# Patient Record
Sex: Female | Born: 1960
Health system: Southern US, Community
[De-identification: ages and names within clinical notes are randomized; demographics above are authoritative.]

## PROBLEM LIST (undated history)

## (undated) DIAGNOSIS — Z973 Presence of spectacles and contact lenses: Secondary | ICD-10-CM

## (undated) DIAGNOSIS — D649 Anemia, unspecified: Secondary | ICD-10-CM

## (undated) DIAGNOSIS — N893 Dysplasia of vagina, unspecified: Secondary | ICD-10-CM

## (undated) DIAGNOSIS — Z6834 Body mass index (BMI) 34.0-34.9, adult: Secondary | ICD-10-CM

## (undated) DIAGNOSIS — G43909 Migraine, unspecified, not intractable, without status migrainosus: Secondary | ICD-10-CM

## (undated) DIAGNOSIS — Z789 Other specified health status: Secondary | ICD-10-CM

## (undated) HISTORY — DX: Migraine, unspecified, not intractable, without status migrainosus: G43.909

## (undated) HISTORY — PX: OTHER SURGICAL HISTORY: SHX169

## (undated) HISTORY — PX: MYOMECTOMY ABDOMINAL APPROACH: SUR870

## (undated) HISTORY — PX: WISDOM TOOTH EXTRACTION: SHX21

## (undated) HISTORY — DX: Body mass index (BMI) 34.0-34.9, adult: Z68.34

---

## 1998-01-16 ENCOUNTER — Ambulatory Visit (HOSPITAL_COMMUNITY): Admission: RE | Admit: 1998-01-16 | Discharge: 1998-01-16 | Payer: Self-pay | Admitting: Internal Medicine

## 1998-01-28 ENCOUNTER — Ambulatory Visit (HOSPITAL_COMMUNITY): Admission: RE | Admit: 1998-01-28 | Discharge: 1998-01-28 | Payer: Self-pay | Admitting: Internal Medicine

## 1998-01-28 ENCOUNTER — Encounter: Payer: Self-pay | Admitting: Internal Medicine

## 2000-10-10 ENCOUNTER — Ambulatory Visit (HOSPITAL_COMMUNITY): Admission: RE | Admit: 2000-10-10 | Discharge: 2000-10-10 | Payer: Self-pay | Admitting: Obstetrics and Gynecology

## 2000-10-10 ENCOUNTER — Encounter: Payer: Self-pay | Admitting: Obstetrics and Gynecology

## 2001-05-08 ENCOUNTER — Encounter: Payer: Self-pay | Admitting: Obstetrics & Gynecology

## 2001-05-08 ENCOUNTER — Ambulatory Visit (HOSPITAL_COMMUNITY): Admission: RE | Admit: 2001-05-08 | Discharge: 2001-05-08 | Payer: Self-pay | Admitting: Obstetrics & Gynecology

## 2001-09-11 ENCOUNTER — Encounter (INDEPENDENT_AMBULATORY_CARE_PROVIDER_SITE_OTHER): Payer: Self-pay | Admitting: *Deleted

## 2001-09-11 ENCOUNTER — Ambulatory Visit (HOSPITAL_BASED_OUTPATIENT_CLINIC_OR_DEPARTMENT_OTHER): Admission: RE | Admit: 2001-09-11 | Discharge: 2001-09-11 | Payer: Self-pay | Admitting: General Surgery

## 2002-07-04 ENCOUNTER — Other Ambulatory Visit: Admission: RE | Admit: 2002-07-04 | Discharge: 2002-07-04 | Payer: Self-pay | Admitting: Obstetrics & Gynecology

## 2003-01-03 ENCOUNTER — Encounter (INDEPENDENT_AMBULATORY_CARE_PROVIDER_SITE_OTHER): Payer: Self-pay | Admitting: Specialist

## 2003-01-03 ENCOUNTER — Inpatient Hospital Stay (HOSPITAL_COMMUNITY): Admission: RE | Admit: 2003-01-03 | Discharge: 2003-01-05 | Payer: Self-pay | Admitting: Obstetrics and Gynecology

## 2003-03-06 ENCOUNTER — Ambulatory Visit (HOSPITAL_COMMUNITY): Admission: RE | Admit: 2003-03-06 | Discharge: 2003-03-06 | Payer: Self-pay | Admitting: Obstetrics and Gynecology

## 2003-03-06 ENCOUNTER — Encounter: Payer: Self-pay | Admitting: Obstetrics and Gynecology

## 2005-02-17 ENCOUNTER — Other Ambulatory Visit: Admission: RE | Admit: 2005-02-17 | Discharge: 2005-02-17 | Payer: Self-pay | Admitting: Obstetrics and Gynecology

## 2005-05-03 ENCOUNTER — Ambulatory Visit: Payer: Self-pay | Admitting: Internal Medicine

## 2005-08-09 ENCOUNTER — Encounter: Admission: RE | Admit: 2005-08-09 | Discharge: 2005-08-09 | Payer: Self-pay | Admitting: Internal Medicine

## 2005-08-24 ENCOUNTER — Encounter: Admission: RE | Admit: 2005-08-24 | Discharge: 2005-08-24 | Payer: Self-pay | Admitting: Internal Medicine

## 2008-03-26 ENCOUNTER — Ambulatory Visit: Payer: Self-pay | Admitting: Internal Medicine

## 2008-06-28 ENCOUNTER — Ambulatory Visit: Payer: Self-pay | Admitting: Internal Medicine

## 2009-06-26 ENCOUNTER — Ambulatory Visit: Payer: Self-pay | Admitting: Internal Medicine

## 2010-06-28 ENCOUNTER — Encounter: Payer: Self-pay | Admitting: Orthopedic Surgery

## 2010-10-23 NOTE — Op Note (Signed)
Victoria Bush, Victoria Bush                       ACCOUNT NO.:  [id number]   MEDICAL RECORD NO.:  [id number]                   PATIENT TYPE:  INP   LOCATION:  9399                                 FACILITY:  [facility]   PHYSICIAN:  Guy Sandifer. Arleta Creek, M.D.           DATE OF BIRTH:  November 10, 1960   DATE OF PROCEDURE:  01/03/2003  DATE OF DISCHARGE:                                 OPERATIVE REPORT   PREOPERATIVE DIAGNOSIS:  Uterine leiomyomata.   POSTOPERATIVE DIAGNOSES:  1. Uterine leiomyomata.  2. Pelvic adhesions.   PROCEDURE:  Uterine myomectomy and lysis of adhesions.   SURGEON:  Guy Sandifer. Henderson Cloud, M.D.   ASSISTANTStann Mainland. Vincente Poli, M.D.   ANESTHESIA:  General with endotracheal intubation, Burnett Corrente, M.D.   ESTIMATED BLOOD LOSS:  200 mL.   SPECIMENS:  Uterine leiomyomata.   INDICATIONS AND CONSENT:  This patient is a 50 year old married black  female, G0, P0, status post uterine myomectomy several years ago with  recurrent uterine leiomyomata.  Details are dictated in the history and  physical.  Laparotomy with uterine myomectomy has been discussed.  Potential  risks and complications have been discussed with the patient preoperatively,  including but not limited to infection, bowel, bladder, or ureteral damage,  bleeding requiring transfusion of blood products with possible transfusion  reaction, HIV and hepatitis acquisition, DVT, PE, pneumonia, recurrent  uterine leiomyomata, and hysterectomy and postoperative tubal obstruction  and infertility.  All questions have been answered and consent is signed on  the chart.   FINDINGS:  There are filmy adhesions of the epiploica to the uterine fundus  that extends across the fundus and over to the left adnexa.  There is  approximately a 6 cm subserosal leiomyoma on the top of the uterus.  There  is a 3-4 cm leiomyoma on the right side of the uterus below the location of  the fallopian tube.  About two-thirds of the fibroid is  on the anterior side  of the uterus and about one-third extends through the meso underneath the  right fallopian tube.  There is a third leiomyoma about 2 cm in diameter  that is subserosal on the lower anterior uterine segment but above the level  of the vesicouterine peritoneum.  The right fallopian tube appears to be  free with delicate-appearing fimbriae.  There are some filmy adhesions of  the right ovary to the pelvic sidewall.  The left fallopian tube appears to  be involved in the adhesive process, and the end of the tube cannot be  identified with certainty.  The left ovary is also adherent to the left  pelvic sidewall.   DESCRIPTION OF PROCEDURE:  The patient is taken to operating room #2, placed  in the dorsal supine position, where general anesthesia is induced via  endotracheal intubation.  She is then prepped abdominally and vaginally, a  Foley catheter is placed and the bladder is drained,  and she is draped in a  sterile fashion.  Incision is made through the previous Pfannenstiel scar  and dissection is carried out in layers to the peritoneum.  She does have  some rather dense subcutaneous scarring.  The peritoneum is entered and  extended superiorly and inferiorly.  An O'Connor-O'Sullivan retractor was  placed, bowel was packed away, and the superior and inferior blades are  placed.  The adhesions to the uterine fundus are taken down sharply.  This  is done under good visualization and clear of the bowel.  The serosa  overlying the larger fibroid is incised with cautery.  This is then  dissected with blunt and sharp dissection.  This enters at least one-half  the thickness of the myometrium, although it does not enter the uterine  cavity.  The defect is closed with 0 Monocryl figure-of-eights and 3-0  Monocryl in a running locking fashion on the serosa.  The fibroid on the  right side of the uterus is entered anteriorly, staying clear of the  fallopian tube.  This is then  also dissected in a similar fashion.  The  incision is extended over the superior part of the fibroid on the anterior  uterine segment, and this is also dissected without difficulty.  This also  stays clear of the bladder.  Defects are closed again with figure-of-eight 0  Monocryls, and the serosa is closed with running locking 3-0 Monocryl.  Good  hemostasis is obtained all around.  Copious irrigation is carried out.  Interceed is placed over the uterine fundus over the suture line.  Packs are  removed, retractors are removed.  All counts are correct.  The anterior  peritoneum is closed in a running fashion with 0 Monocryl suture, which is  also used to reapproximate the pyramidalis muscle in the midline.  The  anterior rectus fascia is closed in a running fashion with 0 PDS suture  starting at each angle and meeting in the middle.  The subcutaneous tissue  on the inferior portion of the incision is undermined slightly with cautery  to allow good reapproximation of the skin edges.  Skin is closed with clips.  All sponge, instrument, and needle counts are correct.  The patient is  awakened and taken to the recovery room in stable condition.                                               Guy Sandifer Arleta Creek, M.D.    JET/MEDQ  D:  01/03/2003  T:  01/03/2003  Job:  [id number]

## 2010-10-23 NOTE — Discharge Summary (Signed)
   Victoria Bush, Victoria Bush                       ACCOUNT NO.:  [id number]   MEDICAL RECORD NO.:  [id number]                   PATIENT TYPE:  INP   LOCATION:  9322                                 FACILITY:  [facility]   PHYSICIAN:  Guy Sandifer. Arleta Creek, M.D.           DATE OF BIRTH:  Nov 15, 1960   DATE OF ADMISSION:  01/03/2003  DATE OF DISCHARGE:  01/05/2003                                 DISCHARGE SUMMARY   ADMITTING DIAGNOSIS:  Uterine leiomyomata.   DISCHARGE DIAGNOSIS:  Uterine leiomyomata.   PROCEDURE:  On January 03, 2003, uterine myomectomy and lysis of adhesions.   REASON FOR ADMISSION:  This patient is a 50 year old married black female,  G0, P0, who is status post uterine myomectomy several years before.  She has  had recurrent growth of fibroids which are now symptomatic.  Details are  dictated in the history and physical.  She is being admitted for surgical  management.   HOSPITAL COURSE:  She was taken to the operating room and underwent the  above procedure.  Estimated blood loss was 200 cc.  On the evening of  surgery, she had good pain relief, was hungry, and was using her incentive  spirometer.  Vital signs were stable, she was afebrile with a clear urine  output.  On the first postoperative day, she was ambulating and voiding.  She had not yet passed flatus.  She remained with stable vital signs, and  was afebrile.  White count was 7.5, hemoglobin 10.7.  On January 05, 2003, she  was doing well, ambulating, tolerating a regular diet.   CONDITION ON DISCHARGE:  Good.   DIET:  Regular as tolerated.   ACTIVITY:  No lifting.  No operation of automobiles.  No vaginal entry.   DISCHARGE INSTRUCTIONS:  She is to call the office for problems including,  but not limited to, temperature of 101 degrees, heavy vaginal bleeding,  persistent nausea and vomiting, or increasing pain.   MEDICATIONS:  Percocet 5/325 mg, #30, 1-2 p.o. q.6h. p.r.n.   FOLLOW UP:  In the office in two  weeks.                                               Guy Sandifer Arleta Creek, M.D.    JET/MEDQ  D:  03/05/2003  T:  03/05/2003  Job:  [id number]

## 2010-10-23 NOTE — H&P (Signed)
NAME:  Victoria Bush, Victoria Bush                       ACCOUNT NO.:  [id number]   MEDICAL RECORD NO.:  [id number]                   PATIENT TYPE:  INP   LOCATION:  NA                                   [facility]ACILITY[facility]  [facility]   PHYSICIAN:  Guy Sandifer. Arleta Creek, M.D.           DATE OF BIRTH:  [birth year]   DATE OF ADMISSION:  01/03/2003  DATE OF DISCHARGE:                                HISTORY & PHYSICAL   CHIEF COMPLAINT:  Uterine fibroids.   HISTORY OF PRESENT ILLNESS:  This patient is a 50 year old married black  female, G0, P0, who is status post uterine myomectomy several years ago.  Since then she has had the recurrence of growing fibroids.  This has been  accompanied by almost daily spotting.  She has increasingly heavy clotting  with her menses as well as bumper dyspareunia.  On examination the uterus  rises to approximately one-half the distance to the umbilicus.  On  ultrasound in my office on September 10, 2002, the uterus measures 11.8 x 7.8 x  7.5 cm with at least four leiomyomata noted ranging from 1.7 to 6.3 cm.  The  ovaries had small follicular cysts but no dominant mass.  On sonohysterogram  there were no intracavitary masses.  The patient reports a normal Pap smear  this year.  After a careful discussion of the options of management, the  patient requests myomectomy.  The potential risks and complications have  been carefully reviewed with the patient preoperatively.  This has included  a discussion of surgical risks as well as possible bilateral tubal  obstruction and recurrent leiomyomata postoperatively.   PAST MEDICAL HISTORY:  1. Uterine leiomyomata as above.  2. Migraine headaches.   PAST SURGICAL HISTORY:  1. Laparotomy with uterine myomectomy.  2. Removal of left and right breast cysts, benign.   PAST OBSTETRICAL HISTORY:  Negative.   FAMILY HISTORY:  Throat cancer in mother, esophageal cancer in father,  hypothyroidism in sister.   SOCIAL HISTORY:  The patient denies  tobacco, alcohol, or drug abuse.   MEDICATIONS:  Vitamins and iron supplements.   No known drug allergies.   REVIEW OF SYSTEMS:  NEUROLOGIC:  History of migraine headaches as above.  CARDIAC:  The patient denies chest pain. PULMONARY:  Denies shortness of  breath.   PHYSICAL EXAMINATION:  VITAL SIGNS:  Height 4 feet 11 inches, weight 155  pounds.  Blood pressure 110/70.  HEENT:  Without thyromegaly.  CHEST:  Lungs clear to auscultation.  CARDIAC:  Regular rate and rhythm.  BACK:  Without CVA tenderness.  BREASTS:  Without mass, retraction, or discharge.  ABDOMEN:  Soft, nontender, with a midline pelvic mass arising approximately  one-half the distance to the umbilicus, which is smooth and nontender.  PELVIC:  Vulva, vagina, and cervix without lesions.  The uterus is mobile,  enlarged, irregular contour, 10-12 weeks in size.  Adnexal exam totally  compromised.  EXTREMITIES:  Grossly within normal limits.  NEUROLOGIC:  Grossly within normal limits.   ASSESSMENT:  Recurrent uterine leiomyomata.   PLAN:  Laparotomy with uterine myomectomy.                                                Guy Sandifer Arleta Creek, M.D.    JET/MEDQ  D:  12/31/2002  T:  01/01/2003  Job:  [id number]

## 2010-10-23 NOTE — Op Note (Signed)
[facility]  Patient:    [name], Victoria Bush Visit Number: [id number] MRN: [id number]          Service Type: DSU Location: [facility] Attending Physician:  Janalyn Rouse Dictated by:   Rose Phi. Maple Hudson, M.D. Proc. Date: 09/11/01 Admit Date:  09/11/2001                             Operative Report  PREOPERATIVE DIAGNOSIS:  Fibrocystic disease of the right breast.  POSTOPERATIVE DIAGNOSIS:  Fibrocystic disease of the right breast.  OPERATION PERFORMED:  Right breast biopsy.  SURGEON:  Rose Phi. Maple Hudson, M.D.  ANESTHESIA:  MAC.  INDICATIONS FOR PROCEDURE:  The patient had a persistent nodular area in the upper outer quadrant of her right breast which was causing her a fair amount of discomfort.  Because of its palpability and its persistent discomfort, we elected to excise it.  DESCRIPTION OF PROCEDURE:  The patient was placed on the operating table with the right arm extended on the arm board.  The right breast was prepped and draped in the usual fashion.  The curvilinear incision was outlined over the palpable nodular area and the area infiltrated with 1% Xylocaine with Adrenalin.  Incision was made with sharp dissection.  This area of breast tissue was excised.  The breast tissue was extremely dense actually very difficult to cut and I think this represents why she was having trouble with it.  Hemostasis obtained with the cautery.  Subcuticular closure with 4-0 Monocryl and Steri-Strips carried.  Dressing applied.  The patient was transferred to the recovery room in satisfactory condition having tolerated the procedure well. Dictated by:   Rose Phi. Maple Hudson, M.D. Attending Physician:  Janalyn Rouse DD:  09/11/01 TD:  09/11/01 Job: [id number]

## 2010-11-26 ENCOUNTER — Ambulatory Visit (HOSPITAL_COMMUNITY): Admission: RE | Admit: 2010-11-26 | Payer: Self-pay | Source: Ambulatory Visit | Admitting: Obstetrics and Gynecology

## 2012-06-12 ENCOUNTER — Encounter (HOSPITAL_COMMUNITY): Payer: Self-pay | Admitting: Pharmacist

## 2012-06-12 NOTE — H&P (Signed)
Victoria Bush is an 52 y.o. female with large uterine fibroids causing pressure and discomfort.  Pertinent Gynecological History: Menses: entering menopause Bleeding: none Contraception: none DES exposure: denies Blood transfusions: none Sexually transmitted diseases: no past history Previous GYN Procedures: none  Last mammogram: normal Date: 2013 Last pap: normal Date: 2013 OB History: G0, P0   Menstrual History: Menarche age: unknown No LMP recorded.    No past medical history on file.  No past surgical history on file.  No family history on file.  Social History:  does not have a smoking history on file. She does not have any smokeless tobacco history on file. Her alcohol and drug histories not on file.  Allergies: No Known Allergies  No prescriptions prior to admission    Review of Systems  Constitutional: Negative for fever.    There were no vitals taken for this visit. Physical Exam  Constitutional: She appears well-developed and well-nourished.  Cardiovascular: Normal rate and regular rhythm.   Respiratory: Effort normal and breath sounds normal.  GI: There is no tenderness.       Mass arising in pelvis to 2 FB below umbilicus  Genitourinary:       Uterus fills pelvis    No results found for this or any previous visit (from the past 24 hour(s)).  No results found.  Assessment/Plan: 52 yo perimenopausal G0P0 with symptomatic uterine fibroids for TAH/BSO. Risks reviewed including infection, organ damage, bleeding/transfusion-HIV/Hep, DVT/PE, pneumonia, fistula, pelvic/abd pain, pain with intercourse.  Victoria Bush II,Victoria Bush E 06/12/2012, 1:54 PM

## 2012-06-16 ENCOUNTER — Encounter (HOSPITAL_COMMUNITY): Payer: Self-pay

## 2012-06-16 ENCOUNTER — Encounter (HOSPITAL_COMMUNITY)
Admission: RE | Admit: 2012-06-16 | Discharge: 2012-06-16 | Disposition: A | Discharge status: Home / Self Care | Payer: BC Managed Care – PPO | Source: Ambulatory Visit | Attending: Obstetrics and Gynecology | Admitting: Obstetrics and Gynecology

## 2012-06-16 HISTORY — DX: Other specified health status: Z78.9

## 2012-06-16 LAB — SURGICAL PCR SCREEN
MRSA, PCR: NEGATIVE
Staphylococcus aureus: POSITIVE — AB

## 2012-06-16 LAB — BASIC METABOLIC PANEL
BUN: 11 mg/dL (ref 6–23)
Creatinine, Ser: 0.72 mg/dL (ref 0.50–1.10)
GFR calc Af Amer: >90 mL/min (ref >90)
GFR calc non Af Amer: >90 mL/min (ref >90)
Glucose, Bld: 88 mg/dL (ref 70–99)

## 2012-06-16 LAB — CBC
HCT: 39 % (ref 36.0–46.0)
Hemoglobin: 13 g/dL (ref 12.0–15.0)
MCH: 27.4 pg (ref 26.0–34.0)
MCHC: 33.3 g/dL (ref 30.0–36.0)
RDW: 13.2 % (ref 11.5–15.5)

## 2012-06-16 NOTE — Patient Instructions (Addendum)
20 Victoria Bush  06/16/2012   Your procedure is scheduled on:  06/19/12  Enter through the Main Entrance of [facility] at 6 AM.  Pick up the phone at the desk and dial [phone].   Call this number if you have problems the morning of surgery: [phone]   Remember:   Do not eat food:After Midnight.  Do not drink clear liquids: After Midnight.  Take these medicines the morning of surgery with A SIP OF WATER: NA   Do not wear jewelry, make-up or nail polish.  Do not wear lotions, powders, or perfumes. You may wear deodorant.  Do not shave 48 hours prior to surgery.  Do not bring valuables to the hospital.  Contacts, dentures or bridgework may not be worn into surgery.  Leave suitcase in the car. After surgery it may be brought to your room.  For patients admitted to the hospital, checkout time is 11:00 AM the day of discharge.   Patients discharged the day of surgery will not be allowed to drive home.  Name and phone number of your driver: NA  Special Instructions: Shower using CHG 2 nights before surgery and the night before surgery.  If you shower the day of surgery use CHG.  Use special wash - you have one bottle of CHG for all showers.  You should use approximately 1/3 of the bottle for each shower.   Please read over the following fact sheets that you were given: MRSA Information

## 2012-06-18 ENCOUNTER — Encounter (HOSPITAL_COMMUNITY): Payer: Self-pay | Admitting: Anesthesiology

## 2012-06-18 NOTE — Anesthesia Preprocedure Evaluation (Addendum)
Anesthesia Evaluation  Patient identified by MRN, date of birth, ID band Patient awake    Reviewed: Allergy & Precautions, H&P , NPO status , Patient's Chart, lab work & pertinent test results  Airway Mallampati: III TM Distance: >3 FB Neck ROM: Full    Dental No notable dental hx. (+) Teeth Intact   Pulmonary neg pulmonary ROS,  breath sounds clear to auscultation  Pulmonary exam normal       Cardiovascular negative cardio ROS  Rhythm:Regular Rate:Normal     Neuro/Psych negative neurological ROS  negative psych ROS   GI/Hepatic negative GI ROS, Neg liver ROS,   Endo/Other  negative endocrine ROS  Renal/GU negative Renal ROS  negative genitourinary   Musculoskeletal negative musculoskeletal ROS (+)   Abdominal   Peds  Hematology negative hematology ROS (+)   Anesthesia Other Findings   Reproductive/Obstetrics Leiomyoma                          Anesthesia Physical Anesthesia Plan  ASA: I  Anesthesia Plan: General   Post-op Pain Management:    Induction: Intravenous  Airway Management Planned: Oral ETT  Additional Equipment:   Intra-op Plan:   Post-operative Plan: Extubation in OR  Informed Consent: I have reviewed the patients History and Physical, chart, labs and discussed the procedure including the risks, benefits and alternatives for the proposed anesthesia with the patient or authorized representative who has indicated his/her understanding and acceptance.   Dental advisory given  Plan Discussed with: CRNA, Anesthesiologist and Surgeon  Anesthesia Plan Comments:         Anesthesia Quick Evaluation

## 2012-06-19 ENCOUNTER — Encounter (HOSPITAL_COMMUNITY): Payer: Self-pay | Admitting: *Deleted

## 2012-06-19 ENCOUNTER — Inpatient Hospital Stay (HOSPITAL_COMMUNITY)
Admission: RE | Admit: 2012-06-19 | Discharge: 2012-06-21 | DRG: 359 | Disposition: A | Discharge status: Home / Self Care | Payer: BC Managed Care – PPO | Source: Ambulatory Visit | Attending: Obstetrics and Gynecology | Admitting: Obstetrics and Gynecology

## 2012-06-19 ENCOUNTER — Encounter (HOSPITAL_COMMUNITY)
Admission: RE | Disposition: A | Discharge status: Home / Self Care | Payer: Self-pay | Source: Ambulatory Visit | Attending: Obstetrics and Gynecology

## 2012-06-19 ENCOUNTER — Encounter (HOSPITAL_COMMUNITY): Payer: Self-pay | Admitting: Anesthesiology

## 2012-06-19 ENCOUNTER — Inpatient Hospital Stay (HOSPITAL_COMMUNITY): Payer: BC Managed Care – PPO | Admitting: Anesthesiology

## 2012-06-19 DIAGNOSIS — D259 Leiomyoma of uterus, unspecified: Principal | ICD-10-CM | POA: Diagnosis present

## 2012-06-19 DIAGNOSIS — Z01818 Encounter for other preprocedural examination: Secondary | ICD-10-CM

## 2012-06-19 DIAGNOSIS — N80109 Endometriosis of ovary, unspecified side, unspecified depth: Secondary | ICD-10-CM | POA: Diagnosis present

## 2012-06-19 DIAGNOSIS — Z01812 Encounter for preprocedural laboratory examination: Secondary | ICD-10-CM

## 2012-06-19 DIAGNOSIS — N80209 Endometriosis of unspecified fallopian tube, unspecified depth: Secondary | ICD-10-CM | POA: Diagnosis present

## 2012-06-19 DIAGNOSIS — N8000 Endometriosis of the uterus, unspecified: Secondary | ICD-10-CM | POA: Diagnosis present

## 2012-06-19 DIAGNOSIS — N802 Endometriosis of fallopian tube: Secondary | ICD-10-CM | POA: Diagnosis present

## 2012-06-19 DIAGNOSIS — N801 Endometriosis of ovary: Secondary | ICD-10-CM | POA: Diagnosis present

## 2012-06-19 HISTORY — PX: ABDOMINAL HYSTERECTOMY: SHX81

## 2012-06-19 HISTORY — PX: SALPINGOOPHORECTOMY: SHX82

## 2012-06-19 LAB — CBC
HCT: 38.9 % (ref 36.0–46.0)
Hemoglobin: 12.8 g/dL (ref 12.0–15.0)
RDW: 13.1 % (ref 11.5–15.5)
WBC: 13.6 10*3/uL — ABNORMAL HIGH (ref 4.0–10.5)

## 2012-06-19 SURGERY — HYSTERECTOMY, ABDOMINAL
Anesthesia: General | Site: Abdomen | Wound class: Clean Contaminated

## 2012-06-19 MED ORDER — HYDROMORPHONE HCL PF 1 MG/ML IJ SOLN
1.0000 mg | INTRAMUSCULAR | Status: DC | PRN
  Administered 2012-06-19 – 2012-06-20 (×2): 1 mg via INTRAVENOUS
  Filled 2012-06-19 (×2): qty 1

## 2012-06-19 MED ORDER — ONDANSETRON HCL 4 MG/2ML IJ SOLN: 4.0000 mg | Freq: Four times a day (QID) | INTRAMUSCULAR | Status: DC | PRN

## 2012-06-19 MED ORDER — NEOSTIGMINE METHYLSULFATE 1 MG/ML IJ SOLN
INTRAMUSCULAR | Status: AC
  Filled 2012-06-19: qty 1

## 2012-06-19 MED ORDER — CEFAZOLIN SODIUM-DEXTROSE 2-3 GM-% IV SOLR
2.0000 g | INTRAVENOUS | Status: AC
  Administered 2012-06-19: 2 g via INTRAVENOUS

## 2012-06-19 MED ORDER — HYDROMORPHONE HCL PF 1 MG/ML IJ SOLN
0.2500 mg | INTRAMUSCULAR | Status: DC | PRN
  Administered 2012-06-19 (×2): 0.5 mg via INTRAVENOUS

## 2012-06-19 MED ORDER — OXYCODONE-ACETAMINOPHEN 5-325 MG PO TABS
1.0000 | ORAL_TABLET | ORAL | Status: DC | PRN
  Administered 2012-06-19: 2 via ORAL
  Filled 2012-06-19: qty 2

## 2012-06-19 MED ORDER — IBUPROFEN 600 MG PO TABS
600.0000 mg | ORAL_TABLET | Freq: Four times a day (QID) | ORAL | Status: DC | PRN
  Administered 2012-06-20 – 2012-06-21 (×3): 600 mg via ORAL
  Filled 2012-06-19 (×3): qty 1

## 2012-06-19 MED ORDER — SODIUM CHLORIDE 0.9 % IJ SOLN: 9.0000 mL | INTRAMUSCULAR | Status: DC | PRN

## 2012-06-19 MED ORDER — MEPERIDINE HCL 25 MG/ML IJ SOLN: 6.2500 mg | INTRAMUSCULAR | Status: DC | PRN

## 2012-06-19 MED ORDER — MIDAZOLAM HCL 5 MG/5ML IJ SOLN
INTRAMUSCULAR | Status: DC | PRN
  Administered 2012-06-19: 2 mg via INTRAVENOUS

## 2012-06-19 MED ORDER — GLYCOPYRROLATE 0.2 MG/ML IJ SOLN
INTRAMUSCULAR | Status: AC
  Filled 2012-06-19: qty 3

## 2012-06-19 MED ORDER — PHENYLEPHRINE HCL 10 MG/ML IJ SOLN
INTRAMUSCULAR | Status: DC | PRN
  Administered 2012-06-19: 120 ug via INTRAVENOUS
  Administered 2012-06-19 (×2): 80 ug via INTRAVENOUS
  Administered 2012-06-19: 40 ug via INTRAVENOUS

## 2012-06-19 MED ORDER — MIDAZOLAM HCL 2 MG/2ML IJ SOLN
INTRAMUSCULAR | Status: AC
  Filled 2012-06-19: qty 2

## 2012-06-19 MED ORDER — DIPHENHYDRAMINE HCL 12.5 MG/5ML PO ELIX: 12.5000 mg | ORAL_SOLUTION | Freq: Four times a day (QID) | ORAL | Status: DC | PRN

## 2012-06-19 MED ORDER — LIDOCAINE HCL (CARDIAC) 20 MG/ML IV SOLN
INTRAVENOUS | Status: DC | PRN
  Administered 2012-06-19: 100 mg via INTRAVENOUS

## 2012-06-19 MED ORDER — LACTATED RINGERS IV SOLN
INTRAVENOUS | Status: DC
  Administered 2012-06-19 (×2): via INTRAVENOUS
  Administered 2012-06-19: 125 mL/h via INTRAVENOUS
  Administered 2012-06-19: 09:00:00 via INTRAVENOUS

## 2012-06-19 MED ORDER — ONDANSETRON HCL 4 MG PO TABS: 4.0000 mg | ORAL_TABLET | Freq: Four times a day (QID) | ORAL | Status: DC | PRN

## 2012-06-19 MED ORDER — 0.9 % SODIUM CHLORIDE (POUR BTL) OPTIME
TOPICAL | Status: DC | PRN
  Administered 2012-06-19: 1000 mL

## 2012-06-19 MED ORDER — DEXAMETHASONE SODIUM PHOSPHATE 4 MG/ML IJ SOLN
INTRAMUSCULAR | Status: DC | PRN
  Administered 2012-06-19: 10 mg via INTRAVENOUS

## 2012-06-19 MED ORDER — NEOSTIGMINE METHYLSULFATE 1 MG/ML IJ SOLN
INTRAMUSCULAR | Status: DC | PRN
  Administered 2012-06-19: 3 mg via INTRAVENOUS

## 2012-06-19 MED ORDER — PHENYLEPHRINE 40 MCG/ML (10ML) SYRINGE FOR IV PUSH (FOR BLOOD PRESSURE SUPPORT)
PREFILLED_SYRINGE | INTRAVENOUS | Status: AC
  Filled 2012-06-19: qty 5

## 2012-06-19 MED ORDER — GLYCOPYRROLATE 0.2 MG/ML IJ SOLN
INTRAMUSCULAR | Status: DC | PRN
  Administered 2012-06-19: 0.4 mg via INTRAVENOUS

## 2012-06-19 MED ORDER — PROPOFOL 10 MG/ML IV EMUL
INTRAVENOUS | Status: DC | PRN
  Administered 2012-06-19: 200 mg via INTRAVENOUS

## 2012-06-19 MED ORDER — METOCLOPRAMIDE HCL 5 MG/ML IJ SOLN
INTRAMUSCULAR | Status: AC
  Administered 2012-06-19: 10 mg via INTRAVENOUS
  Filled 2012-06-19: qty 2

## 2012-06-19 MED ORDER — MENTHOL 3 MG MT LOZG: 1.0000 | LOZENGE | OROMUCOSAL | Status: DC | PRN

## 2012-06-19 MED ORDER — CEFAZOLIN SODIUM-DEXTROSE 2-3 GM-% IV SOLR
INTRAVENOUS | Status: AC
  Filled 2012-06-19: qty 50

## 2012-06-19 MED ORDER — ROCURONIUM BROMIDE 100 MG/10ML IV SOLN
INTRAVENOUS | Status: DC | PRN
  Administered 2012-06-19: 30 mg via INTRAVENOUS

## 2012-06-19 MED ORDER — ALUM & MAG HYDROXIDE-SIMETH 200-200-20 MG/5ML PO SUSP: 30.0000 mL | ORAL | Status: DC | PRN

## 2012-06-19 MED ORDER — EPHEDRINE 5 MG/ML INJ
INTRAVENOUS | Status: AC
  Filled 2012-06-19: qty 10

## 2012-06-19 MED ORDER — DOCUSATE SODIUM 100 MG PO CAPS
100.0000 mg | ORAL_CAPSULE | Freq: Two times a day (BID) | ORAL | Status: DC
  Administered 2012-06-20 – 2012-06-21 (×3): 100 mg via ORAL
  Filled 2012-06-19 (×3): qty 1

## 2012-06-19 MED ORDER — LACTATED RINGERS IV SOLN
INTRAVENOUS | Status: DC
  Administered 2012-06-20: 03:00:00 via INTRAVENOUS

## 2012-06-19 MED ORDER — DIPHENHYDRAMINE HCL 50 MG/ML IJ SOLN: 12.5000 mg | Freq: Four times a day (QID) | INTRAMUSCULAR | Status: DC | PRN

## 2012-06-19 MED ORDER — METOCLOPRAMIDE HCL 5 MG/ML IJ SOLN
10.0000 mg | Freq: Once | INTRAMUSCULAR | Status: AC | PRN
  Administered 2012-06-19: 10 mg via INTRAVENOUS

## 2012-06-19 MED ORDER — ZOLPIDEM TARTRATE 5 MG PO TABS: 5.0000 mg | ORAL_TABLET | Freq: Every evening | ORAL | Status: DC | PRN

## 2012-06-19 MED ORDER — HYDROMORPHONE 0.3 MG/ML IV SOLN: INTRAVENOUS | Status: DC

## 2012-06-19 MED ORDER — ROCURONIUM BROMIDE 50 MG/5ML IV SOLN
INTRAVENOUS | Status: AC
  Filled 2012-06-19: qty 1

## 2012-06-19 MED ORDER — ONDANSETRON HCL 4 MG/2ML IJ SOLN
INTRAMUSCULAR | Status: AC
  Filled 2012-06-19: qty 2

## 2012-06-19 MED ORDER — HYDROMORPHONE HCL PF 1 MG/ML IJ SOLN
INTRAMUSCULAR | Status: AC
  Administered 2012-06-19: 0.5 mg via INTRAVENOUS
  Filled 2012-06-19: qty 1

## 2012-06-19 MED ORDER — PROPOFOL 10 MG/ML IV EMUL
INTRAVENOUS | Status: AC
  Filled 2012-06-19: qty 20

## 2012-06-19 MED ORDER — EPHEDRINE SULFATE 50 MG/ML IJ SOLN
INTRAMUSCULAR | Status: DC | PRN
  Administered 2012-06-19: 10 mg via INTRAVENOUS

## 2012-06-19 MED ORDER — DEXAMETHASONE SODIUM PHOSPHATE 10 MG/ML IJ SOLN
INTRAMUSCULAR | Status: AC
  Filled 2012-06-19: qty 1

## 2012-06-19 MED ORDER — LIDOCAINE HCL (CARDIAC) 20 MG/ML IV SOLN
INTRAVENOUS | Status: AC
  Filled 2012-06-19: qty 5

## 2012-06-19 MED ORDER — FENTANYL CITRATE 0.05 MG/ML IJ SOLN
INTRAMUSCULAR | Status: AC
  Filled 2012-06-19: qty 5

## 2012-06-19 MED ORDER — ONDANSETRON HCL 4 MG/2ML IJ SOLN
INTRAMUSCULAR | Status: DC | PRN
  Administered 2012-06-19: 4 mg via INTRAVENOUS

## 2012-06-19 MED ORDER — NALOXONE HCL 0.4 MG/ML IJ SOLN: 0.4000 mg | INTRAMUSCULAR | Status: DC | PRN

## 2012-06-19 MED ORDER — FENTANYL CITRATE 0.05 MG/ML IJ SOLN
INTRAMUSCULAR | Status: DC | PRN
  Administered 2012-06-19: 100 ug via INTRAVENOUS
  Administered 2012-06-19 (×2): 50 ug via INTRAVENOUS

## 2012-06-19 SURGICAL SUPPLY — 30 items
CANISTER SUCTION 2500CC (MISCELLANEOUS) ×3 IMPLANT
CLOTH BEACON ORANGE TIMEOUT ST (SAFETY) ×3 IMPLANT
CONT PATH 16OZ SNAP LID 3702 (MISCELLANEOUS) ×3 IMPLANT
DECANTER SPIKE VIAL GLASS SM (MISCELLANEOUS) IMPLANT
DRSG OPSITE POSTOP 4X10 (GAUZE/BANDAGES/DRESSINGS) ×1 IMPLANT
ELECT LIGASURE SHORT 9 REUSE (ELECTRODE) ×1 IMPLANT
GLOVE BIO SURGEON STRL SZ 6 (GLOVE) ×1 IMPLANT
GLOVE BIO SURGEON STRL SZ8 (GLOVE) ×6 IMPLANT
GLOVE BIOGEL PI IND STRL 6.5 (GLOVE) IMPLANT
GLOVE BIOGEL PI INDICATOR 6.5 (GLOVE) ×1
GLOVE INDICATOR 6.0 STRL GRN (GLOVE) ×1 IMPLANT
GLOVE INDICATOR 6.5 STRL GRN (GLOVE) ×1 IMPLANT
GOWN PREVENTION PLUS LG XLONG (DISPOSABLE) ×6 IMPLANT
NS IRRIG 1000ML POUR BTL (IV SOLUTION) ×3 IMPLANT
PACK ABDOMINAL GYN (CUSTOM PROCEDURE TRAY) ×3 IMPLANT
PAD OB MATERNITY 4.3X12.25 (PERSONAL CARE ITEMS) ×3 IMPLANT
PROTECTOR NERVE ULNAR (MISCELLANEOUS) ×3 IMPLANT
SPONGE LAP 18X18 X RAY DECT (DISPOSABLE) ×6 IMPLANT
STAPLER VISISTAT 35W (STAPLE) ×3 IMPLANT
STRIP CLOSURE SKIN 1/4X4 (GAUZE/BANDAGES/DRESSINGS) ×3 IMPLANT
SUT MNCRL 0 MO-4 VIOLET 18 CR (SUTURE) ×6 IMPLANT
SUT MNCRL 0 VIOLET 6X18 (SUTURE) ×2 IMPLANT
SUT MNCRL AB 0 CT1 27 (SUTURE) ×3 IMPLANT
SUT MONOCRYL 0 6X18 (SUTURE) ×1
SUT MONOCRYL 0 MO 4 18  CR/8 (SUTURE) ×3
SUT PDS AB 0 CTX 60 (SUTURE) ×6 IMPLANT
SUT PLAIN 2 0 XLH (SUTURE) IMPLANT
TOWEL OR 17X24 6PK STRL BLUE (TOWEL DISPOSABLE) ×6 IMPLANT
TRAY FOLEY CATH 14FR (SET/KITS/TRAYS/PACK) ×3 IMPLANT
WATER STERILE IRR 1000ML POUR (IV SOLUTION) ×3 IMPLANT

## 2012-06-19 NOTE — Addendum Note (Signed)
Addendum  created 06/19/12 1758 by Orlie Pollen, CRNA   Modules edited:Notes Section

## 2012-06-19 NOTE — Anesthesia Postprocedure Evaluation (Signed)
  Anesthesia Post-op Note  Patient: Victoria Bush  Procedure(s) Performed: Procedure(s) (LRB) with comments: HYSTERECTOMY ABDOMINAL (N/A) SALPINGO OOPHORECTOMY (Bilateral)  Patient Location: [facility]  Anesthesia Type:General  Level of Consciousness: awake, alert  and oriented  Airway and Oxygen Therapy: Patient Spontanous Breathing  Post-op Pain: mild  Post-op Assessment: Post-op Vital signs reviewed and Patient's Cardiovascular Status Stable  Post-op Vital Signs: Reviewed and stable  Complications: No apparent anesthesia complications

## 2012-06-19 NOTE — Progress Notes (Signed)
Some nausea, no vomiting Good pain relief  Blood pressure 136/77, pulse 92, temperature 98.1 F (36.7 C), temperature source Oral, resp. rate 16, height 4\' 11"  (1.499 m), weight 156 lb (70.761 kg), SpO2 95.00%.  UO clear  Lungs CTA Cor RRR  Adb soft, BS+, Dressing C&D Ext PAS on  A: Stable P: Per orders

## 2012-06-19 NOTE — Transfer of Care (Signed)
Immediate Anesthesia Transfer of Care Note  Patient: Victoria Bush  Procedure(s) Performed: Procedure(s) (LRB) with comments: HYSTERECTOMY ABDOMINAL (N/A) SALPINGO OOPHORECTOMY (Bilateral)  Patient Location: PACU  Anesthesia Type:General  Level of Consciousness: awake, alert  and oriented  Airway & Oxygen Therapy: Patient Spontanous Breathing  Post-op Assessment: Report given to PACU RN  Post vital signs: Reviewed and stable  Complications: No apparent anesthesia complications

## 2012-06-19 NOTE — Progress Notes (Signed)
Reviewed with patient TAH/BSO and possible midline incision No changes to H&P per patient history

## 2012-06-19 NOTE — Anesthesia Procedure Notes (Signed)
Procedure Name: Intubation Date/Time: 06/19/2012 7:26 AM Performed by: Selina Tapper, Jannet Askew Pre-anesthesia Checklist: Patient identified, Emergency Drugs available, Suction available, Patient being monitored and Timeout performed Patient Re-evaluated:Patient Re-evaluated prior to inductionOxygen Delivery Method: Circle system utilized Preoxygenation: Pre-oxygenation with 100% oxygen Intubation Type: IV induction Ventilation: Mask ventilation without difficulty Laryngoscope Size: Mac and 3 Grade View: Grade III Number of attempts: 2 Placement Confirmation: ETT inserted through vocal cords under direct vision,  breath sounds checked- equal and bilateral and positive ETCO2 Secured at: 22 cm Dental Injury: Teeth and Oropharynx as per pre-operative assessment

## 2012-06-19 NOTE — Anesthesia Postprocedure Evaluation (Signed)
Anesthesia Post Note  Patient: Victoria Bush  Procedure(s) Performed: Procedure(s) (LRB): HYSTERECTOMY ABDOMINAL (N/A) SALPINGO OOPHORECTOMY (Bilateral)  Anesthesia type: GA  Patient location: PACU  Post pain: Pain level controlled  Post assessment: Post-op Vital signs reviewed  Last Vitals:  Filed Vitals:   06/19/12 1005  BP:   Pulse: 71  Temp:   Resp: 12    Post vital signs: Reviewed  Level of consciousness: sedated  Complications: No apparent anesthesia complications

## 2012-06-19 NOTE — Brief Op Note (Signed)
06/19/2012  8:55 AM  PATIENT:  Marlane B Galicia  52 y.o. female  PRE-OPERATIVE DIAGNOSIS:  fibroids  POST-OPERATIVE DIAGNOSIS:  uterine fibroids with endometriosis  PROCEDURE:  Procedure(s) (LRB) with comments: HYSTERECTOMY ABDOMINAL (N/A) SALPINGO OOPHORECTOMY (Bilateral)  SURGEON:  Surgeon(s) and Role:    * Leslie Andrea, MD - Primary    * Mitchel Honour, DO - Assisting  PHYSICIAN ASSISTANT:   ASSISTANTS: none   ANESTHESIA:   general  EBL:  Total I/O In: 2000 [I.V.:2000] Out: 200 [Urine:100; Blood:100]  BLOOD ADMINISTERED:none  DRAINS: Urinary Catheter (Foley)   LOCAL MEDICATIONS USED:  NONE  SPECIMEN:  Source of Specimen:  uterus, bilateral tubes/ovaries  DISPOSITION OF SPECIMEN:  PATHOLOGY  COUNTS:  YES  TOURNIQUET:  * No tourniquets in log *  DICTATION: .Other Dictation: Dictation Number [id number]  PLAN OF CARE: Admit to inpatient   PATIENT DISPOSITION:  PACU - hemodynamically stable.   Delay start of Pharmacological VTE agent (>24hrs) due to surgical blood loss or risk of bleeding: not applicable

## 2012-06-20 ENCOUNTER — Encounter (HOSPITAL_COMMUNITY): Payer: Self-pay | Admitting: Obstetrics and Gynecology

## 2012-06-20 NOTE — Progress Notes (Signed)
POD #1 Tolerating yogurt, no flatus yet, ambulating, foley just removed  Blood pressure 91/55, pulse 88, temperature 98.6 F (37 C), temperature source Oral, resp. rate 18, height 4\' 11"  (1.499 m), weight 156 lb (70.761 kg), SpO2 96.00%.  Lungs CTA Abd soft with good BS Ext NT  Results for orders placed during the hospital encounter of 06/19/12 (from the past 24 hour(s))  CBC     Status: Abnormal   Collection Time   06/19/12 11:29 AM      Component Value Range   WBC 13.6 (*) 4.0 - 10.5 K/uL   RBC 4.75  3.87 - 5.11 MIL/uL   Hemoglobin 12.8  12.0 - 15.0 g/dL   HCT 16.1  09.6 - 04.5 %   MCV 81.9  78.0 - 100.0 fL   MCH 26.9  26.0 - 34.0 pg   MCHC 32.9  30.0 - 36.0 g/dL   RDW 40.9  81.1 - 91.4 %   Platelets 300  150 - 400 K/uL   A: Satisfactory  P: Per orders

## 2012-06-20 NOTE — Progress Notes (Signed)
UR completed 

## 2012-06-20 NOTE — Op Note (Signed)
Victoria Bush, Victoria Bush             ACCOUNT NO.:  [id number]  MEDICAL RECORD NO.:  [id number]  LOCATION:  9319                          FACILITY:  [facility]  PHYSICIAN:  Guy Sandifer. Henderson Cloud, M.D. DATE OF BIRTH:  [birth year]  DATE OF PROCEDURE:  06/19/2012 DATE OF DISCHARGE:                              OPERATIVE REPORT   PREOPERATIVE DIAGNOSIS:  Uterine leiomyomata.  POSTOPERATIVE DIAGNOSIS: 1. Uterine leiomyomata. 2. Endometriosis.  PROCEDURE:  Total abdominal hysterectomy with bilateral salpingo- oophorectomy.  SURGEON:  Guy Sandifer. Henderson Cloud, M.D.  ASSISTANT:  Mitchel Honour D.O.  ANESTHESIA:  General with endotracheal intubation.  SPECIMENS:  Uterus, bilateral tubes, and ovaries to pathology.  ESTIMATED BLOOD LOSS:  100 mL.  INDICATIONS AND CONSENT:  The patient is a 52 year old patient with known uterine leiomyomata status post myomectomy x2.  She has had recurrent fibroids that extend about 16 to 18 weeks in size.  After discussion of options, she is being admitted for total abdominal hysterectomy.  Options of the ovarian preservation versus BSO have been reviewed and she would like BSO as well.  Potential risks and complications of the procedure have been discussed preoperatively including but not limited to, infection, organ damage, bleeding requiring transfusion of blood products with HIV and hepatitis acquisition, DVT, PE, pneumonia, fistula formation, pelvic pain, painful intercourse.  Issues of menopause have been reviewed.  Possible midline incision has also been reviewed preoperatively.  All questions have been answered and consent is signed on the chart.  FINDINGS:  Upper abdomen palpates grossly normal.  Uterus has a posterior uterine leiomyomata, about 16 weeks in size.  There is some filmy adhesions to the adnexa bilaterally as well as to the posterior uterine fundus.  There were several implants of dark black endometriosis around the fallopian tubes and ovaries  bilaterally.  DESCRIPTION OF PROCEDURE:  The patient was taken to the operating room, where she was identified, placed in a dorsal supine position and general anesthesia was induced via endotracheal intubation.  She is placed in a dorsal lithotomy position.  Time-out was undertaken.  She is prepped abdominally and vaginally.  Foley catheter was placed and the bladder was drained.  Examination under anesthesia was carried out.  Due to the shape and size of the fibroids, a midline incision was chosen.  She was draped in sterile fashion.  The skin was entered through infraumbilical midline incision.  Dissection was carried out in layers at the peritoneum which was entered and extended superiorly and inferiorly. The Andrey Spearman retractor was placed.  The upper bowel was packed away.  The proximal ligaments were grasped with a curved clamp.  The left round ligament was identified, taken down with bipolar cautery. The anterior leaf of the broad ligament was then taken down sharply down to the level of vesicouterine peritoneum.  Posterior leaf was perforated.  There were some adhesions preventing adequate elevation of the ovary which cannot be adequately visualized due to the fibroids. Therefore, the proximal ligaments were taken down, leaving the ovary for later.  The uterus elevates into the incision for adequate visualization.  Progressive bites were taken of the adnexa down to the level of the uterine vessels which were taken with cautery.  Uterine vessels were then ligated with a suture.  All suture be 0 Monocryl unless otherwise designated.  The bladder was also advanced.  The right round ligament was also taken down with cautery which allows the remainder of the anterior broad ligament to be taken down as well.  The right infundibulopelvic ligament was taken down and is ligated with cautery.  Progressive bites were taken down the adnexa to the level of the uterine vessels which  were ligated with suture.  To allow adequate visualization, the uterine fundus was sharply resected under good visualization.  After properly advancing the bladder, the remainder of the cervix was removed.  Cuff was closed with figure-of-eights.  The angle sutures were then tied in the midline.  The adhesions around the left ovary were then taken down sharply without difficulty.  This allows good mobilization of the infundibulopelvic ligament, which can be taken down and ligated with a free tie, then with a suture of 0 Monocryl. Irrigation was carried out and minor bleeding was cauterized from the peritoneal edges.  Good hemostasis was noted all around.  Packs and retractors were removed.  The incision was closed with a 0 looped PDS starting at the superior and inferior angle and meeting in the middle. The subcutaneous layer was closed with interrupted plain suture and the skin was closed with clips.  Urine output remains clear.  All sponge, instrument, needle counts were correct, and the patient is awakened taken to recovery room in stable condition.     Guy Sandifer Henderson Cloud, M.D.     JET/MEDQ  D:  06/19/2012  T:  06/20/2012  Job:  [id number]

## 2012-06-21 MED ORDER — IBUPROFEN 600 MG PO TABS: 600.0000 mg | ORAL_TABLET | Freq: Four times a day (QID) | ORAL | Status: DC | PRN

## 2012-06-21 NOTE — Discharge Summary (Signed)
Physician Discharge Summary  Patient ID: Victoria Bush MRN: [id number] DOB/AGE: [birth year] 52 y.o.  Admit date: 06/19/2012 Discharge date: 06/21/2012  Admission Diagnoses:Uterine fibroids   Discharge Diagnoses: Uterine fibroids Active Problems:  * No active hospital problems. *    Discharged Condition: good  Hospital Course: admitted for TAH/BSO. Good post op recovery of bowel function, ambulating well, tolerating regular diet, good pain relief.  Consults: None  Significant Diagnostic Studies: labs:  Results for orders placed during the hospital encounter of 06/19/12 (from the past 48 hour(s))  CBC     Status: Abnormal   Collection Time   06/19/12 11:29 AM      Component Value Range Comment   WBC 13.6 (*) 4.0 - 10.5 K/uL    RBC 4.75  3.87 - 5.11 MIL/uL    Hemoglobin 12.8  12.0 - 15.0 g/dL    HCT 56.2  13.0 - 86.5 %    MCV 81.9  78.0 - 100.0 fL    MCH 26.9  26.0 - 34.0 pg    MCHC 32.9  30.0 - 36.0 g/dL    RDW 78.4  69.6 - 29.5 %    Platelets 300  150 - 400 K/uL     Treatments: IV hydration, analgesia: ibuprofen and surgery: TAH/BSO  Discharge Exam: Blood pressure 121/83, pulse 88, temperature 98.2 F (36.8 C), temperature source Oral, resp. rate 18, height 4\' 11"  (1.499 m), weight 156 lb (70.761 kg), SpO2 100.00%. General appearance: alert, cooperative and no distress GI: incision healing well, soft, good BS  Disposition:      Medication List     As of 06/21/2012  9:08 AM    STOP taking these medications         Biotin 1000 MCG tablet      multivitamin capsule      TAKE these medications         ibuprofen 600 MG tablet   Commonly known as: ADVIL,MOTRIN   Take 1 tablet (600 mg total) by mouth every 6 (six) hours as needed (mild pain).      oxyCODONE-acetaminophen 5-325 MG per tablet   Commonly known as: PERCOCET/ROXICET   Take 1 tablet by mouth every 4 (four) hours as needed. For headache           Follow-up Information    Call in 5 days to  follow up.         Signed: Ramone Gander II,Nehemyah Foushee E 06/21/2012, 9:08 AM

## 2012-06-21 NOTE — Progress Notes (Signed)
Feeling good, using ibuprofen only Passing flatus, voiding, tolerating regular diet, ambulating well  Blood pressure 121/83, pulse 88, temperature 98.2 F (36.8 C), temperature source Oral, resp. rate 18, height 4\' 11"  (1.499 m), weight 156 lb (70.761 kg), SpO2 100.00%.  Abdomen soft, incision healing well  A: good progress  P: D/C  home

## 2012-06-21 NOTE — Progress Notes (Signed)
Pt  D/c home to return to MD office and have staples rmoved in 5  Days  Teaching complete

## 2014-08-15 ENCOUNTER — Other Ambulatory Visit: Payer: Self-pay | Admitting: Obstetrics and Gynecology

## 2014-08-16 ENCOUNTER — Encounter: Payer: Self-pay | Admitting: Gastroenterology

## 2014-08-19 LAB — CYTOLOGY - PAP

## 2014-09-13 ENCOUNTER — Encounter

## 2014-09-26 ENCOUNTER — Encounter: Payer: Self-pay | Admitting: Gastroenterology

## 2014-09-26 ENCOUNTER — Encounter: Admitting: Gastroenterology

## 2014-10-04 ENCOUNTER — Encounter: Admitting: Gastroenterology

## 2014-10-11 ENCOUNTER — Encounter

## 2014-10-18 ENCOUNTER — Encounter: Admitting: Gastroenterology

## 2014-10-29 ENCOUNTER — Encounter: Payer: Self-pay | Admitting: Gastroenterology

## 2014-11-08 ENCOUNTER — Encounter

## 2014-11-22 ENCOUNTER — Encounter

## 2014-11-26 ENCOUNTER — Encounter: Payer: Self-pay | Admitting: Obstetrics and Gynecology

## 2014-12-06 ENCOUNTER — Encounter: Admitting: Gastroenterology

## 2015-07-18 ENCOUNTER — Encounter: Payer: Self-pay | Admitting: Internal Medicine

## 2015-07-18 ENCOUNTER — Other Ambulatory Visit: Payer: BLUE CROSS/BLUE SHIELD | Admitting: Internal Medicine

## 2015-07-18 DIAGNOSIS — Z1321 Encounter for screening for nutritional disorder: Secondary | ICD-10-CM

## 2015-07-18 DIAGNOSIS — Z130 Encounter for screening for diseases of the blood and blood-forming organs and certain disorders involving the immune mechanism: Secondary | ICD-10-CM

## 2015-07-18 DIAGNOSIS — Z1329 Encounter for screening for other suspected endocrine disorder: Secondary | ICD-10-CM

## 2015-07-18 DIAGNOSIS — Z0000 Encounter for general adult medical examination without abnormal findings: Secondary | ICD-10-CM

## 2015-07-18 DIAGNOSIS — Z13220 Encounter for screening for lipoid disorders: Secondary | ICD-10-CM

## 2015-07-18 LAB — CBC WITH DIFFERENTIAL/PLATELET
Basophils Absolute: 0 10*3/uL (ref 0.0–0.1)
Basophils Relative: 0 % (ref 0–1)
EOS ABS: 0.2 10*3/uL (ref 0.0–0.7)
EOS PCT: 3 % (ref 0–5)
HCT: 40.6 % (ref 36.0–46.0)
Hemoglobin: 13.2 g/dL (ref 12.0–15.0)
LYMPHS ABS: 3 10*3/uL (ref 0.7–4.0)
Lymphocytes Relative: 55 % — ABNORMAL HIGH (ref 12–46)
MCH: 26.3 pg (ref 26.0–34.0)
MCHC: 32.5 g/dL (ref 30.0–36.0)
MCV: 81 fL (ref 78.0–100.0)
MONOS PCT: 9 % (ref 3–12)
MPV: 9.4 fL (ref 8.6–12.4)
Monocytes Absolute: 0.5 10*3/uL (ref 0.1–1.0)
Neutro Abs: 1.8 10*3/uL (ref 1.7–7.7)
Neutrophils Relative %: 33 % — ABNORMAL LOW (ref 43–77)
PLATELETS: 356 10*3/uL (ref 150–400)
RBC: 5.01 MIL/uL (ref 3.87–5.11)
RDW: 13.7 % (ref 11.5–15.5)
WBC: 5.5 10*3/uL (ref 4.0–10.5)

## 2015-07-18 LAB — COMPLETE METABOLIC PANEL WITH GFR
ALBUMIN: 3.9 g/dL (ref 3.6–5.1)
ALK PHOS: 71 U/L (ref 33–130)
ALT: 11 U/L (ref 6–29)
AST: 15 U/L (ref 10–35)
BUN: 10 mg/dL (ref 7–25)
CALCIUM: 9.3 mg/dL (ref 8.6–10.4)
CO2: 25 mmol/L (ref 20–31)
Chloride: 104 mmol/L (ref 98–110)
Creat: 0.66 mg/dL (ref 0.50–1.05)
GLUCOSE: 93 mg/dL (ref 65–99)
POTASSIUM: 3.8 mmol/L (ref 3.5–5.3)
Sodium: 137 mmol/L (ref 135–146)
Total Bilirubin: 0.5 mg/dL (ref 0.2–1.2)
Total Protein: 7.2 g/dL (ref 6.1–8.1)

## 2015-07-18 LAB — LIPID PANEL
CHOL/HDL RATIO: 4.7 ratio (ref <=5.0)
CHOLESTEROL: 232 mg/dL — AB (ref 125–200)
HDL: 49 mg/dL (ref >=46)
LDL Cholesterol: 165 mg/dL — ABNORMAL HIGH (ref <130)
TRIGLYCERIDES: 91 mg/dL (ref <150)
VLDL: 18 mg/dL (ref <30)

## 2015-07-18 LAB — TSH: TSH: 3.58 mIU/L

## 2015-07-19 LAB — VITAMIN D 25 HYDROXY (VIT D DEFICIENCY, FRACTURES): Vit D, 25-Hydroxy: 18 ng/mL — ABNORMAL LOW (ref 30–100)

## 2015-07-25 ENCOUNTER — Ambulatory Visit (INDEPENDENT_AMBULATORY_CARE_PROVIDER_SITE_OTHER): Payer: BLUE CROSS/BLUE SHIELD | Admitting: Internal Medicine

## 2015-07-25 ENCOUNTER — Encounter: Payer: Self-pay | Admitting: Internal Medicine

## 2015-07-25 VITALS — BP 124/84 | HR 76 | Temp 97.6°F | Resp 18 | Ht 59.0 in | Wt 173.0 lb

## 2015-07-25 DIAGNOSIS — Z79890 Hormone replacement therapy (postmenopausal): Secondary | ICD-10-CM

## 2015-07-25 DIAGNOSIS — J309 Allergic rhinitis, unspecified: Secondary | ICD-10-CM

## 2015-07-25 DIAGNOSIS — Z0000 Encounter for general adult medical examination without abnormal findings: Secondary | ICD-10-CM | POA: Diagnosis not present

## 2015-07-25 DIAGNOSIS — J069 Acute upper respiratory infection, unspecified: Secondary | ICD-10-CM

## 2015-07-25 DIAGNOSIS — E669 Obesity, unspecified: Secondary | ICD-10-CM | POA: Diagnosis not present

## 2015-07-25 DIAGNOSIS — E559 Vitamin D deficiency, unspecified: Secondary | ICD-10-CM

## 2015-07-25 DIAGNOSIS — E785 Hyperlipidemia, unspecified: Secondary | ICD-10-CM | POA: Diagnosis not present

## 2015-07-25 DIAGNOSIS — Z7189 Other specified counseling: Secondary | ICD-10-CM

## 2015-07-25 LAB — POCT URINALYSIS DIPSTICK
Bilirubin, UA: NEGATIVE
Blood, UA: NEGATIVE
Glucose, UA: NEGATIVE
Ketones, UA: NEGATIVE
LEUKOCYTES UA: NEGATIVE
NITRITE UA: NEGATIVE
PH UA: 6
PROTEIN UA: NEGATIVE
Spec Grav, UA: 1.025
Urobilinogen, UA: 0.2

## 2015-07-25 MED ORDER — PHENTERMINE HCL 37.5 MG PO CAPS: 37.5000 mg | ORAL_CAPSULE | ORAL | Status: DC

## 2015-07-25 MED ORDER — AMOXICILLIN-POT CLAVULANATE 500-125 MG PO TABS: 1.0000 | ORAL_TABLET | Freq: Three times a day (TID) | ORAL | Status: DC

## 2015-07-25 NOTE — Patient Instructions (Addendum)
Prescription for phentermine 37.5 mg 1 by mouth daily for 90 days only. Explained patient this would not be refilled. TSH and free T4 needs to be rechecked in 3 months. Take 2000 units vitamin D 3 daily. Encouraged diet and exercise. Recheck lipids in 6 months.

## 2015-07-30 ENCOUNTER — Telehealth: Payer: Self-pay | Admitting: Internal Medicine

## 2015-07-30 NOTE — Telephone Encounter (Signed)
Patient is having a dry, hacky cough.  Wants to know if you will give her something for the cough.  States that she's not able to move anything at all.  State she's drinking plenty of fluids, but still having a bad cough.    Pharmacy:  [facility] [address]

## 2015-07-30 NOTE — Telephone Encounter (Signed)
Call in Zithromax Z pak 2 po day 1 followed by one po days 2-5. Take Delsym over the counter for cough. If not better in 7 days needs OV or sooner if worse.

## 2015-07-31 MED ORDER — AZITHROMYCIN 250 MG PO TABS: ORAL_TABLET | ORAL | Status: DC

## 2015-07-31 NOTE — Telephone Encounter (Signed)
Medication sent to [facility]. Notified patient via vcm

## 2015-08-05 ENCOUNTER — Encounter: Payer: Self-pay | Admitting: Internal Medicine

## 2015-08-05 DIAGNOSIS — Z0000 Encounter for general adult medical examination without abnormal findings: Secondary | ICD-10-CM | POA: Insufficient documentation

## 2015-08-05 DIAGNOSIS — Z131 Encounter for screening for diabetes mellitus: Secondary | ICD-10-CM | POA: Insufficient documentation

## 2015-08-05 DIAGNOSIS — E559 Vitamin D deficiency, unspecified: Secondary | ICD-10-CM | POA: Insufficient documentation

## 2015-08-05 DIAGNOSIS — Z7189 Other specified counseling: Secondary | ICD-10-CM | POA: Insufficient documentation

## 2015-08-05 DIAGNOSIS — E785 Hyperlipidemia, unspecified: Secondary | ICD-10-CM | POA: Insufficient documentation

## 2015-08-05 DIAGNOSIS — J309 Allergic rhinitis, unspecified: Secondary | ICD-10-CM | POA: Insufficient documentation

## 2015-08-05 NOTE — Progress Notes (Signed)
   Subjective:    Patient ID: Victoria Bush, female    DOB: [birth year], 55 y.o.   MRN: [id number]  HPI 55 year old Black Female resides in [place] called recently about reestablishing here. She was last seen here in 2010. Since then she underwent hysterectomy BSO by Dr. Gertie Fey in 2014. She takes estradiol tablets. Was tested in 2008 by Dr. Retta Mac for allergic rhinitis and had mild reactions to mold. Was placed on Nasonex and Clarinex with Optivar eyedrops. In 2007 had right lower lobe pneumonia.  Remote history of benign breast biopsies in right and left breast. Left breast biopsy was at age 44 and right breast biopsy was in 2003  No known drug allergies.  Both parents deceased at age 30 due to cancer. Patient says father had esophageal cancer and mother had throat cancer. Sister with history of migraine headaches, impaired glucose tolerance and mental issues.  Social history: She is married. Has high school education. She resides with husband and sister in [place]. Does not smoke. Sometimes consumes alcohol but not often. 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  She has a remote history of migraine headaches treated with Topamax for [facility] in [facility] in 2010. History of total cholesterol of 236 with an LDL cholesterol of 154 in 2010.  History of uterine myomectomy 2 around 2004 before hysterectomy.  Seen by Dr.  Henrene Pastor  for 2 month history of third clearing, globus sensation and pyrosis likely related to reflux disease treated with PPI in 2006.  Seen by Dr. Elie Goody, ENT physician for here pain related to TMJ dysfunction in 2009.  Had Pneumovax 23 in 2007 for history of pneumonia  No known drug allergies but hydrocodone causes nausea    Review of Systems as above     Objective:   Physical Exam  Constitutional: She is oriented to person, place, and time. She appears well-developed and well-nourished. No distress.  HENT:  Head:  Normocephalic and atraumatic.  Right Ear: External ear normal.  Mouth/Throat: Oropharynx is clear and moist.  Eyes: Pupils are equal, round, and reactive to light. Right eye exhibits no discharge. Left eye exhibits no discharge.  Neck: No JVD present.  Cardiovascular: Normal rate, regular rhythm, normal heart sounds and intact distal pulses.   No murmur heard. Pulmonary/Chest: Effort normal and breath sounds normal. She has no wheezes.  Breasts normal female  Abdominal: Soft. Bowel sounds are normal. She exhibits no distension and no mass. There is no tenderness. There is no rebound and no guarding.  Genitourinary:  Deferred to GYN  Musculoskeletal: She exhibits no edema.  Lymphadenopathy:    She has no cervical adenopathy.  Neurological: She is alert and oriented to person, place, and time. She has normal reflexes. Coordination normal.  Skin: Skin is warm and dry. No rash noted. She is not diaphoretic.  Psychiatric: She has a normal mood and affect. Her behavior is normal. Judgment and thought content normal.  Vitals reviewed.         Assessment & Plan:  Hyperlipidemia-needs trial of diet and exercise and recheck in 6 months  Obesity-patient given phentermine at her request 37.5 mg daily for 90 days only  Status post total abdominal hysterectomy BSO by Dr. Gaetano Net and is on estrogen replacement therapy  Allergic rhinitis  Acute URI-treated with Augmentin for 10 days  TSH in the mid 3 range. Repeat in 3- 6 months.  Vitamin D deficiency-take 2000 units vitamin D 3 daily

## 2015-08-08 ENCOUNTER — Encounter: Payer: Self-pay | Admitting: Gastroenterology

## 2015-09-26 ENCOUNTER — Ambulatory Visit (AMBULATORY_SURGERY_CENTER): Payer: Self-pay | Admitting: *Deleted

## 2015-09-26 ENCOUNTER — Telehealth: Payer: Self-pay | Admitting: *Deleted

## 2015-09-26 VITALS — Ht 59.0 in | Wt 169.8 lb

## 2015-09-26 DIAGNOSIS — Z1211 Encounter for screening for malignant neoplasm of colon: Secondary | ICD-10-CM

## 2015-09-26 MED ORDER — NA SULFATE-K SULFATE-MG SULF 17.5-3.13-1.6 GM/177ML PO SOLN: 1.0000 | Freq: Once | ORAL | Status: DC

## 2015-09-26 NOTE — Telephone Encounter (Signed)
No worries. Okay with me

## 2015-09-26 NOTE — Telephone Encounter (Signed)
Dr Henrene Pastor and Dr Fuller Plan, This lady is coming in today for a PV. She saw Dr Henrene Pastor in the office back in 2006 but has never had a procedure. She is scheduled with Dr Fuller Plan for a colon 5-5 Friday at 8 am. I saw reviewing her chart she saw Dr Henrene Pastor and Barbie Haggis attempted to call her to RS to Dr Henrene Pastor for me.   She has called and states she has made arrangements for 5-5 and she wishes to see Dr Fuller Plan for her procedure. She was very upset that we tried to RS her. Is it ok to leave her as scheduled? Please advise and thanks, Marijean Niemann

## 2015-09-26 NOTE — Telephone Encounter (Signed)
OK with me.

## 2015-09-26 NOTE — Progress Notes (Signed)
No egg or soy allergy known to patient  No issues with past sedation with any surgeries  or procedures, no intubation problems  No diet pills per patient- off phentermine with no plan to restart now  No home 02 use per patient  No blood thinners per patient  Pt denies issues with constipation

## 2015-09-26 NOTE — Telephone Encounter (Signed)
thnak you, will proceed with 5-5 colon. Thanks, marie PV

## 2015-10-02 ENCOUNTER — Encounter: Admitting: Internal Medicine

## 2015-10-10 ENCOUNTER — Encounter: Admitting: Gastroenterology

## 2015-10-10 ENCOUNTER — Encounter: Payer: Self-pay | Admitting: Gastroenterology

## 2015-10-10 ENCOUNTER — Ambulatory Visit (INDEPENDENT_AMBULATORY_CARE_PROVIDER_SITE_OTHER): Payer: BLUE CROSS/BLUE SHIELD | Admitting: Internal Medicine

## 2015-10-10 ENCOUNTER — Ambulatory Visit (AMBULATORY_SURGERY_CENTER): Admitting: Gastroenterology

## 2015-10-10 ENCOUNTER — Encounter: Payer: Self-pay | Admitting: Internal Medicine

## 2015-10-10 VITALS — BP 95/62 | HR 59 | Temp 99.3°F | Resp 16 | Ht 59.0 in | Wt 169.0 lb

## 2015-10-10 VITALS — BP 120/82 | HR 72 | Temp 98.2°F | Resp 12 | Ht 59.0 in | Wt 168.0 lb

## 2015-10-10 DIAGNOSIS — H6503 Acute serous otitis media, bilateral: Secondary | ICD-10-CM

## 2015-10-10 DIAGNOSIS — H6092 Unspecified otitis externa, left ear: Secondary | ICD-10-CM | POA: Diagnosis not present

## 2015-10-10 DIAGNOSIS — Z1211 Encounter for screening for malignant neoplasm of colon: Secondary | ICD-10-CM | POA: Diagnosis not present

## 2015-10-10 MED ORDER — NEOMYCIN-POLYMYXIN-HC 1 % OT SOLN: 3.0000 [drp] | Freq: Four times a day (QID) | OTIC | Status: DC

## 2015-10-10 MED ORDER — SODIUM CHLORIDE 0.9 % IV SOLN: 500.0000 mL | INTRAVENOUS | Status: DC

## 2015-10-10 MED ORDER — AZITHROMYCIN 250 MG PO TABS: ORAL_TABLET | ORAL | Status: DC

## 2015-10-10 NOTE — Progress Notes (Signed)
   Subjective:    Patient ID: Victoria Bush, female    DOB: 1961-03-07, 55 y.o.   MRN: [id number]  HPI 55 year old Black Female just had colonoscopy this morning. Here today with a one-week history of ear pain. Says left ear is worse than the right. Complaining of soreness when she touches her left ear. No fever or chills. No sore throat.    Review of Systems     Objective:   Physical Exam Skin warm and dry. Nodes none. Neck is supple without adenopathy. Both TMs are full and pink bilaterally. There is some erythema in the left external ear canal. Cerumen in both ear canals but not impacted. Chest is clear to auscultation. However she sounds nasally congested.       Assessment & Plan:  Acute bilateral serous otitis media  Acute URI  Acute left otitis externa  Plan: Cortisporin Otic suspension to use in left external ear canal 4 drops 3 or 4 times a day for 5 days. Zithromax Z-PAK take 2 tablets day one followed by 1 tablet days 2 through 5.

## 2015-10-10 NOTE — Progress Notes (Signed)
Report to PACU, RN, vss, BBS= Clear.  

## 2015-10-10 NOTE — Patient Instructions (Addendum)
YOU HAD AN ENDOSCOPIC PROCEDURE TODAY AT [facility]:   Refer to the procedure report that was given to you for any specific questions about what was found during the examination.  If the procedure report does not answer your questions, please call your gastroenterologist to clarify.  If you requested that your care partner not be given the details of your procedure findings, then the procedure report has been included in a sealed envelope for you to review at your convenience later.  YOU SHOULD EXPECT: Some feelings of bloating in the abdomen. Passage of more gas than usual.  Walking can help get rid of the air that was put into your GI tract during the procedure and reduce the bloating. If you had a lower endoscopy (such as a colonoscopy or flexible sigmoidoscopy) you may notice spotting of blood in your stool or on the toilet paper. If you underwent a bowel prep for your procedure, you may not have a normal bowel movement for a few days.  Please Note:  You might notice some irritation and congestion in your nose or some drainage.  This is from the oxygen used during your procedure.  There is no need for concern and it should clear up in a day or so.  SYMPTOMS TO REPORT IMMEDIATELY:   Following lower endoscopy (colonoscopy or flexible sigmoidoscopy):  Excessive amounts of blood in the stool  Significant tenderness or worsening of abdominal pains  Swelling of the abdomen that is new, acute  Fever of 100F or higher   For urgent or emergent issues, a gastroenterologist can be reached at any hour by calling [phone].   DIET: Your first meal following the procedure should be a small meal and then it is ok to progress to your normal diet. Heavy or fried foods are harder to digest and may make you feel nauseous or bloated.  Likewise, meals heavy in dairy and vegetables can increase bloating.  Drink plenty of fluids but you should avoid alcoholic beverages for 24  hours.  ACTIVITY:  You should plan to take it easy for the rest of today and you should NOT DRIVE or use heavy machinery until tomorrow (because of the sedation medicines used during the test).    FOLLOW UP: Our staff will call the number listed on your records the next business day following your procedure to check on you and address any questions or concerns that you may have regarding the information given to you following your procedure. If we do not reach you, we will leave a message.  However, if you are feeling well and you are not experiencing any problems, there is no need to return our call.  We will assume that you have returned to your regular daily activities without incident.  If any biopsies were taken you will be contacted by phone or by letter within the next 1-3 weeks.  Please call us at [phone] if you have not heard about the biopsies in 3 weeks.    SIGNATURES/CONFIDENTIALITY: You and/or your care partner have signed paperwork which will be entered into your electronic medical record.  These signatures attest to the fact that that the information above on your After Visit Summary has been reviewed and is understood.  Full responsibility of the confidentiality of this discharge information lies with you and/or your care-partner.  Recall colonosopy 10 years-2027.

## 2015-10-10 NOTE — Patient Instructions (Signed)
Take Zithromax Z-PAK as directed. Use Cortisporin external ear drops 4 drops in left external ear canal 3-4 times daily for 5 days.

## 2015-10-10 NOTE — Op Note (Signed)
[facility] Patient Name: Victoria Bush Procedure Date: 10/10/2015 7:54 AM MRN: [id number] Endoscopist: Ladene Artist , MD Age: 55 Date of Birth: 06/08/1960 Gender: Female Procedure:                Colonoscopy Indications:              Screening for colorectal malignant neoplasm Medicines:                Monitored Anesthesia Care Procedure:                Pre-Anesthesia Assessment:                           - Prior to the procedure, a History and Physical                            was performed, and patient medications and                            allergies were reviewed. The patient's tolerance of                            previous anesthesia was also reviewed. The risks                            and benefits of the procedure and the sedation                            options and risks were discussed with the patient.                            All questions were answered, and informed consent                            was obtained. Prior Anticoagulants: The patient has                            taken no previous anticoagulant or antiplatelet                            agents. ASA Grade Assessment: II - A patient with                            mild systemic disease. After reviewing the risks                            and benefits, the patient was deemed in                            satisfactory condition to undergo the procedure.                           After obtaining informed consent, the colonoscope  was passed under direct vision. Throughout the                            procedure, the patient's blood pressure, pulse, and                            oxygen saturations were monitored continuously. The                            Model PCF-H190DL [id number]) scope was introduced                            through the anus and advanced to the the cecum,                            identified by appendiceal orifice and ileocecal                         valve. The ileocecal valve, appendiceal orifice,                            and rectum were photographed. The quality of the                            bowel preparation was excellent. The colonoscopy                            was performed without difficulty. The patient                            tolerated the procedure well. Scope In: 8:15:19 AM Scope Out: 8:29:21 AM Scope Withdrawal Time: 0 hours 10 minutes 52 seconds  Total Procedure Duration: 0 hours 14 minutes 2 seconds  Findings:                 The entire examined colon appeared normal on direct                            and retroflexion views. Complications:            No immediate complications. Estimated blood loss:                            None. Estimated Blood Loss:     Estimated blood loss: none. Impression:               - The entire examined colon is normal on direct and                            retroflexion views. Recommendation:           - Repeat colonoscopy in 10 years for screening                            purposes.                           -  Patient has a contact number available for                            emergencies. The signs and symptoms of potential                            delayed complications were discussed with the                            patient. Return to normal activities tomorrow.                            Written discharge instructions were provided to the                            patient.                           - Resume previous diet.                           - Continue present medications. Ladene Artist, MD 10/10/2015 8:32:32 AM This report has been signed electronically.

## 2015-10-13 ENCOUNTER — Telehealth: Payer: Self-pay

## 2015-10-13 NOTE — Telephone Encounter (Signed)
  Follow up Call-  Call back number 10/10/2015  Post procedure Call Back phone  # [phone]  Permission to leave phone message Yes    Patient was called for follow up after her procedure on 10/10/2015. No answer at the number given for follow up phone call. A message was left on the answering machine.

## 2015-10-17 ENCOUNTER — Other Ambulatory Visit: Payer: BLUE CROSS/BLUE SHIELD | Admitting: Internal Medicine

## 2015-10-17 DIAGNOSIS — E785 Hyperlipidemia, unspecified: Secondary | ICD-10-CM

## 2015-10-17 DIAGNOSIS — E038 Other specified hypothyroidism: Secondary | ICD-10-CM

## 2015-10-17 LAB — T4, FREE: Free T4: 0.9 ng/dL (ref 0.8–1.8)

## 2015-10-17 LAB — LIPID PANEL
CHOL/HDL RATIO: 4.2 ratio (ref <=5.0)
Cholesterol: 212 mg/dL — ABNORMAL HIGH (ref 125–200)
HDL: 51 mg/dL (ref >=46)
LDL CALC: 141 mg/dL — AB (ref <130)
Triglycerides: 102 mg/dL (ref <150)
VLDL: 20 mg/dL (ref <30)

## 2015-10-17 LAB — TSH: TSH: 2.18 m[IU]/L

## 2015-10-24 ENCOUNTER — Encounter: Payer: Self-pay | Admitting: Internal Medicine

## 2015-10-24 ENCOUNTER — Ambulatory Visit (INDEPENDENT_AMBULATORY_CARE_PROVIDER_SITE_OTHER): Payer: BLUE CROSS/BLUE SHIELD | Admitting: Internal Medicine

## 2015-10-24 VITALS — BP 128/88 | HR 72 | Temp 98.7°F | Resp 20 | Ht 59.0 in | Wt 168.0 lb

## 2015-10-24 DIAGNOSIS — E785 Hyperlipidemia, unspecified: Secondary | ICD-10-CM | POA: Diagnosis not present

## 2015-10-24 DIAGNOSIS — Z1329 Encounter for screening for other suspected endocrine disorder: Secondary | ICD-10-CM | POA: Diagnosis not present

## 2015-10-24 DIAGNOSIS — E559 Vitamin D deficiency, unspecified: Secondary | ICD-10-CM

## 2015-10-25 LAB — VITAMIN D 25 HYDROXY (VIT D DEFICIENCY, FRACTURES): VIT D 25 HYDROXY: 30 ng/mL (ref 30–100)

## 2015-10-25 LAB — TSH: TSH: 3.1 mIU/L

## 2015-10-29 NOTE — Progress Notes (Signed)
   Subjective:    Patient ID: Victoria Bush, female    DOB: 1960-11-12, 55 y.o.   MRN: [id number]  HPI  Patient in today to follow-up on hyperlipidemia. She's been watching her diet and trying to get some exercise. Also had repeat TSH because last visit TSH was 2.18. Is now 3.10. We will continue to monitor for development of possible hypothyroidism. Her vitamin D level was low as well. It is now low normal at 30.  Last visit total cholesterol was 232 and it is now 212. LDL cholesterol has improved from 165-141. Triglycerides are normal. She had a recent colonoscopy which was normal.    Review of Systems     Objective:   Physical Exam   Not examined. Spent 15 minutes reviewing lab work with her.      Assessment & Plan:   Hyperlipidemia-improved with diet alone -continue to monitor    Vitamin D deficiency- continue over-the-counter vitamin D 2000 units daily  Monitoring TSH for possible development of hypothyroidism-continue to monitor. Patient had physical exam in February. Return in February 2018 for physical exam and follow-up on these issues.

## 2015-10-29 NOTE — Patient Instructions (Signed)
Continue diet and exercise regimen. Return in February 2018 for physical exam. Take vitamin D over-the-counter 2000 units daily.

## 2016-04-16 ENCOUNTER — Ambulatory Visit (INDEPENDENT_AMBULATORY_CARE_PROVIDER_SITE_OTHER): Payer: BLUE CROSS/BLUE SHIELD | Admitting: Internal Medicine

## 2016-04-16 ENCOUNTER — Encounter: Payer: Self-pay | Admitting: Internal Medicine

## 2016-04-16 ENCOUNTER — Ambulatory Visit
Admission: RE | Admit: 2016-04-16 | Discharge: 2016-04-16 | Disposition: A | Discharge status: Home / Self Care | Payer: BLUE CROSS/BLUE SHIELD | Source: Ambulatory Visit | Attending: Internal Medicine | Admitting: Internal Medicine

## 2016-04-16 VITALS — BP 124/86 | HR 65 | Temp 98.2°F | Wt 170.0 lb

## 2016-04-16 DIAGNOSIS — R05 Cough: Secondary | ICD-10-CM

## 2016-04-16 DIAGNOSIS — J209 Acute bronchitis, unspecified: Secondary | ICD-10-CM

## 2016-04-16 DIAGNOSIS — R059 Cough, unspecified: Secondary | ICD-10-CM

## 2016-04-16 MED ORDER — CEFTRIAXONE SODIUM 1 G IJ SOLR: 1.0000 g | Freq: Once | INTRAMUSCULAR | Status: DC

## 2016-04-16 MED ORDER — CEFTRIAXONE SODIUM 1 G IJ SOLR
1.0000 g | Freq: Once | INTRAMUSCULAR | Status: AC
  Administered 2016-04-16: 1 g via INTRAMUSCULAR

## 2016-04-16 MED ORDER — LEVOFLOXACIN 500 MG PO TABS: 500.0000 mg | ORAL_TABLET | Freq: Every day | ORAL | 0 refills | Status: DC

## 2016-04-16 MED ORDER — BENZONATATE 100 MG PO CAPS: 100.0000 mg | ORAL_CAPSULE | Freq: Three times a day (TID) | ORAL | 0 refills | Status: DC

## 2016-04-16 NOTE — Progress Notes (Signed)
   Subjective:    Patient ID: Victoria Bush, female    DOB: February 20, 1961, 55 y.o.   MRN: [id number]  HPI   55 year old Female onset  recently of URI symptoms. No fever or chills. Has cough and congestion. Some sputum production Slight scratchy throat.    Review of Systems see above     Objective:   Physical Exam  Pharynx very slightly injected. TMs are clear. Neck is supple. Chest exam shows inspiratory wheezing check we'll the right. She sounds a bit hoarse when she speaks.      Assessment & Plan:  Acute bronchitis- possible early pneumonia  Plan: Chest x-ray, Rocephin 1 g IM. Levaquin 500 milligrams daily for 10 days. Tessalon Perles 100 mg 3 times daily for cough. Rest and drink plenty of fluids. Given sample of inhaler to use 2 sprays by mouth every 12 hours  Addendum: Chest x-ray is negative for pneumonia.

## 2016-04-16 NOTE — Progress Notes (Signed)
Lm on pt vm with above results.

## 2016-04-16 NOTE — Progress Notes (Signed)
   Subjective:    Patient ID: Victoria Bush, female    DOB: 06/27/1960, 55 y.o.   MRN: [id number]  HPI  55 year old     Review of Systems     Objective:   Physical Exam        Assessment & Plan:

## 2016-05-02 NOTE — Patient Instructions (Signed)
Rocephin 1 g IM. Levaquin 500 milligrams daily for 10 days. Use inhaler sample 2 sprays by mouth every 12 hours. Tessalon Perles 100 mg 3 times daily as needed for cough. Rest and drink plenty of fluids.

## 2016-05-07 ENCOUNTER — Encounter: Payer: Self-pay | Admitting: Internal Medicine

## 2016-05-07 ENCOUNTER — Ambulatory Visit (INDEPENDENT_AMBULATORY_CARE_PROVIDER_SITE_OTHER): Payer: BLUE CROSS/BLUE SHIELD | Admitting: Internal Medicine

## 2016-05-07 VITALS — BP 118/86 | HR 72 | Temp 98.0°F | Ht 59.0 in | Wt 176.0 lb

## 2016-05-07 DIAGNOSIS — Z23 Encounter for immunization: Secondary | ICD-10-CM | POA: Diagnosis not present

## 2016-05-07 DIAGNOSIS — J209 Acute bronchitis, unspecified: Secondary | ICD-10-CM | POA: Diagnosis not present

## 2016-05-07 NOTE — Patient Instructions (Signed)
Bronchitis has resolved. Flu vaccine given.

## 2016-05-07 NOTE — Progress Notes (Signed)
   Subjective:    Patient ID: Victoria Bush, female    DOB: 1960/09/18, 55 y.o.   MRN: [id number]  HPI   Patient was seen November 10 with acute bronchitis. She had inspiratory wheezing. Chest x-ray was negative. She was treated with Rocephin and Levaquin. She was given an inhaler and Tessalon Perles. Says it took about a week for her to improved. We deferred flu vaccine at that time. She's now feeling much better. No new complaints or problems.    Review of Systems     Objective:   Physical Exam  TMs are clear. Pharynx is clear. She sounds a bit nasally congested but is not complaining of any symptoms. Neck is supple. Chest clear to auscultation without rales or wheezing      Assessment & Plan:  Acute bronchitis-resolved  Plan: Flu vaccine given.

## 2016-07-23 ENCOUNTER — Other Ambulatory Visit: Payer: BLUE CROSS/BLUE SHIELD | Admitting: Internal Medicine

## 2016-07-30 ENCOUNTER — Ambulatory Visit (INDEPENDENT_AMBULATORY_CARE_PROVIDER_SITE_OTHER): Payer: BLUE CROSS/BLUE SHIELD | Admitting: Internal Medicine

## 2016-07-30 ENCOUNTER — Encounter: Payer: Self-pay | Admitting: Internal Medicine

## 2016-07-30 ENCOUNTER — Other Ambulatory Visit: Payer: BLUE CROSS/BLUE SHIELD | Admitting: Internal Medicine

## 2016-07-30 VITALS — BP 130/88 | HR 80 | Temp 97.9°F | Ht 58.5 in | Wt 171.0 lb

## 2016-07-30 DIAGNOSIS — E7800 Pure hypercholesterolemia, unspecified: Secondary | ICD-10-CM

## 2016-07-30 DIAGNOSIS — Z0000 Encounter for general adult medical examination without abnormal findings: Secondary | ICD-10-CM

## 2016-07-30 DIAGNOSIS — E785 Hyperlipidemia, unspecified: Secondary | ICD-10-CM

## 2016-07-30 DIAGNOSIS — E559 Vitamin D deficiency, unspecified: Secondary | ICD-10-CM

## 2016-07-30 LAB — COMPREHENSIVE METABOLIC PANEL
ALT: 9 U/L (ref 6–29)
AST: 14 U/L (ref 10–35)
Albumin: 3.9 g/dL (ref 3.6–5.1)
Alkaline Phosphatase: 71 U/L (ref 33–130)
BUN: 14 mg/dL (ref 7–25)
CHLORIDE: 108 mmol/L (ref 98–110)
CO2: 23 mmol/L (ref 20–31)
CREATININE: 0.86 mg/dL (ref 0.50–1.05)
Calcium: 9.3 mg/dL (ref 8.6–10.4)
Glucose, Bld: 89 mg/dL (ref 65–99)
Potassium: 4.3 mmol/L (ref 3.5–5.3)
SODIUM: 141 mmol/L (ref 135–146)
TOTAL PROTEIN: 7.2 g/dL (ref 6.1–8.1)
Total Bilirubin: 0.6 mg/dL (ref 0.2–1.2)

## 2016-07-30 LAB — LIPID PANEL
CHOLESTEROL: 218 mg/dL — AB (ref <200)
HDL: 49 mg/dL — ABNORMAL LOW (ref >50)
LDL CALC: 153 mg/dL — AB (ref <100)
Total CHOL/HDL Ratio: 4.4 Ratio (ref <5.0)
Triglycerides: 78 mg/dL (ref <150)
VLDL: 16 mg/dL (ref <30)

## 2016-07-30 LAB — POCT URINALYSIS DIPSTICK
Bilirubin, UA: NEGATIVE
Blood, UA: NEGATIVE
Glucose, UA: NEGATIVE
KETONES UA: NEGATIVE
Leukocytes, UA: NEGATIVE
Nitrite, UA: NEGATIVE
PH UA: 6.5
Protein, UA: NEGATIVE
SPEC GRAV UA: 1.015
Urobilinogen, UA: NEGATIVE

## 2016-07-30 LAB — CBC WITH DIFFERENTIAL/PLATELET
BASOS ABS: 0 {cells}/uL (ref 0–200)
BASOS PCT: 0 %
EOS PCT: 5 %
Eosinophils Absolute: 305 cells/uL (ref 15–500)
HCT: 41.1 % (ref 35.0–45.0)
Hemoglobin: 13.3 g/dL (ref 11.7–15.5)
Lymphocytes Relative: 56 %
Lymphs Abs: 3416 cells/uL (ref 850–3900)
MCH: 26.5 pg — ABNORMAL LOW (ref 27.0–33.0)
MCHC: 32.4 g/dL (ref 32.0–36.0)
MCV: 82 fL (ref 80.0–100.0)
MONOS PCT: 7 %
MPV: 9.1 fL (ref 7.5–12.5)
Monocytes Absolute: 427 cells/uL (ref 200–950)
Neutro Abs: 1952 cells/uL (ref 1500–7800)
Neutrophils Relative %: 32 %
PLATELETS: 334 10*3/uL (ref 140–400)
RBC: 5.01 MIL/uL (ref 3.80–5.10)
RDW: 14 % (ref 11.0–15.0)
WBC: 6.1 10*3/uL (ref 3.8–10.8)

## 2016-07-30 LAB — TSH: TSH: 1.85 mIU/L

## 2016-07-31 LAB — VITAMIN D 25 HYDROXY (VIT D DEFICIENCY, FRACTURES): VIT D 25 HYDROXY: 20 ng/mL — AB (ref 30–100)

## 2016-07-31 NOTE — Patient Instructions (Signed)
It was a pleasure to see you today. Watch diet and exercise and try to lose a bit of weight for hyperlipidemia. Return in one year or as needed.

## 2016-07-31 NOTE — Progress Notes (Signed)
Subjective:    Patient ID: Victoria Bush, female    DOB: [birth year], 56 y.o.   MRN: [id number]  HPI 56 year old Black female in today for health maintenance exam.  Past medical history: She underwent total abdominal hysterectomy BSO for uterine fibroids by Dr. Gaetano Net in 2014. She takes estradiol tablets. Was tested in 2008 by Dr. Retta Mac for allergic rhinitis and had mild reactions to mold. Was placed on Nasonex, Clarinex with Optivar eyedrops.  In 2007 she had right lower lobe pneumonia.  Remote history of benign breast biopsies in right and left breasts. Left breast biopsy was at age 66 and right breast biopsy was in 2003.  No known drug allergies.   Family  history: Both parents deceased at age 21 due to cancer. Patient says father had esophageal cancer and mother had throat cancer. Sister with history of migraine headaches, and per glucose tolerance and mental issues.  Social history: She is married. Has a high school education. Resides with her husband and sister in [place]. Does not smoke. Sometimes consumes alcohol but not often. 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 She wants to go on a cruise in 2019.  She has a remote history of migraine headaches treated with Topamax by [facility] neurological clinic in [facility] in 2010. History of total cholesterol of 236 with an LDL cholesterol of 154 in 2010.  History of uterine myomectomy twice around 2004 before hysterectomy.  Has seen Dr. Henrene Pastor for two-month history of throat clearing, globus sensation and pyrosis likely related to reflux disease treated with PPI and 2006.  Colonoscopy 2017 with instructions to repeat study in 10 years. This was done by Dr. Fuller Plan.  Seen by Dr. Elie Goody ENT physician for  ear pain related to TMJ dysfunction in 2009.  Was given Pneumovax 23 in 2007 for history of pneumonia.  No known drug allergies but hydrocodone causes  nausea.    Review of Systems see above. No new complaints     Objective:   Physical Exam  Constitutional: She is oriented to person, place, and time. She appears well-developed and well-nourished. No distress.  HENT:  Head: Normocephalic and atraumatic.  Right Ear: External ear normal.  Left Ear: External ear normal.  Mouth/Throat: Oropharynx is clear and moist. No oropharyngeal exudate.  Eyes: Conjunctivae and EOM are normal. Pupils are equal, round, and reactive to light. Right eye exhibits no discharge. Left eye exhibits no discharge. No scleral icterus.  Neck: Neck supple. No JVD present. No thyromegaly present.  Cardiovascular: Normal rate, regular rhythm, normal heart sounds and intact distal pulses.   No murmur heard. Pulmonary/Chest: Effort normal and breath sounds normal. No respiratory distress. She has no wheezes. She has no rales. She exhibits no tenderness.  Breasts normal female without masses  Abdominal: Soft. Bowel sounds are normal. She exhibits no distension and no mass. There is no tenderness. There is no rebound and no guarding.  Genitourinary:  Genitourinary Comments: Deferred to Dr. Gaetano Net but is status post TAH/BSO  Musculoskeletal: She exhibits no edema.  Neurological: She is alert and oriented to person, place, and time. She has normal reflexes. No cranial nerve deficit. Coordination normal.  Skin: Skin is warm and dry. No rash noted. She is not diaphoretic.  Psychiatric: She has a normal mood and affect. Her behavior is normal. Judgment and thought content normal.  Vitals reviewed.         Assessment &  Plan:  Normal health maintenance exam  History of allergic rhinitis  History of TMJ dysfunction in 2009  Status post total abdominal hysterectomy BSO for uterine fibroids 2014  Remote history of migraine headaches  Plan: Lab work reviewed and is within normal limits as separate LDL of 153. Encouraged diet exercise and weight loss. Return in one  year or when necessary.

## 2016-09-10 ENCOUNTER — Encounter: Payer: Self-pay | Admitting: Internal Medicine

## 2016-09-10 ENCOUNTER — Ambulatory Visit (INDEPENDENT_AMBULATORY_CARE_PROVIDER_SITE_OTHER): Payer: BLUE CROSS/BLUE SHIELD | Admitting: Internal Medicine

## 2016-09-10 VITALS — BP 110/80 | HR 69 | Temp 97.9°F | Ht 59.0 in | Wt 172.0 lb

## 2016-09-10 DIAGNOSIS — J069 Acute upper respiratory infection, unspecified: Secondary | ICD-10-CM | POA: Diagnosis not present

## 2016-09-10 DIAGNOSIS — R059 Cough, unspecified: Secondary | ICD-10-CM

## 2016-09-10 DIAGNOSIS — R05 Cough: Secondary | ICD-10-CM | POA: Diagnosis not present

## 2016-09-10 MED ORDER — METHYLPREDNISOLONE ACETATE 80 MG/ML IJ SUSP
80.0000 mg | Freq: Once | INTRAMUSCULAR | Status: AC
Start: 2016-09-10 — End: 2016-09-10
  Administered 2016-09-10: 80 mg via INTRAMUSCULAR

## 2016-09-10 MED ORDER — LEVOFLOXACIN 500 MG PO TABS: 500.0000 mg | ORAL_TABLET | Freq: Every day | ORAL | 0 refills | Status: DC

## 2016-09-10 MED ORDER — BENZONATATE 100 MG PO CAPS: 100.0000 mg | ORAL_CAPSULE | Freq: Three times a day (TID) | ORAL | 1 refills | Status: DC | PRN

## 2016-09-10 NOTE — Patient Instructions (Signed)
Depo-Medrol 80 mg IM. Levaquin 500 milligrams daily for 10 days. Tessalon Perles 100 mg 3 times daily as needed for cough. Rest and drink plenty of fluids. Call if not better in one week.

## 2016-09-10 NOTE — Progress Notes (Signed)
   Subjective:    Patient ID: Victoria Bush, female    DOB: 14-Jul-1960, 56 y.o.   MRN: [id number]  HPI Onset URI symptoms last week with sore throat fever, cough, and congestion. Has malaise and fatigue. Had bronchitis in November. Had routine health maintenance exam February.  Cough has remained. She also sounds nasally congested and has discolored sputum production and nasal discharge. Stuffy and nose. Some maxillary sinus pressure.        Review of Systems see above     Objective:   Physical Exam  Skin warm and dry. Nodes none. TMs and pharynx are clear. Neck is supple without adenopathy. Chest clear to auscultation without rales or wheezing.      Assessment & Plan:  Acute URI Plan:Depo-Medrol 80 mg IM. Levaquin 500 milligrams daily for 10 days. Tessalon Perles 100 mg up to 3 times daily as needed for cough. Rest and drink plenty of fluids.

## 2016-12-24 ENCOUNTER — Encounter: Payer: Self-pay | Admitting: Internal Medicine

## 2017-04-08 ENCOUNTER — Encounter: Payer: Self-pay | Admitting: Internal Medicine

## 2017-04-08 ENCOUNTER — Ambulatory Visit (INDEPENDENT_AMBULATORY_CARE_PROVIDER_SITE_OTHER): Payer: BLUE CROSS/BLUE SHIELD | Admitting: Internal Medicine

## 2017-04-08 VITALS — BP 130/88 | HR 82 | Temp 97.9°F | Wt 170.0 lb

## 2017-04-08 DIAGNOSIS — Z6834 Body mass index (BMI) 34.0-34.9, adult: Secondary | ICD-10-CM | POA: Diagnosis not present

## 2017-04-08 DIAGNOSIS — Z23 Encounter for immunization: Secondary | ICD-10-CM

## 2017-04-08 MED ORDER — PHENTERMINE HCL 37.5 MG PO CAPS: 37.5000 mg | ORAL_CAPSULE | ORAL | 0 refills | Status: DC

## 2017-04-08 NOTE — Progress Notes (Signed)
   Subjective:    Patient ID: Victoria Bush, female    DOB: Aug 05, 1960, 56 y.o.   MRN: [id number]  HPI Victoria Bush is here today to follow-up on obesity.  Her weight has not changed.  At one point we gave her a prescription for phentermine but apparently she never had it filled and kept It for about a year.  When she went to have it filled at the pharmacy of course since it was over a year old they  would not fill it.  She wants to try phentermine again.  Spoke with her about diet and exercise. Her sister still lives with her and has mental problems. Likely is Bipolar. 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  Wants flu vaccine today also.    Review of Systems no new complaints     Objective:   Physical Exam Neck supple. Chest clear. No thyromegaly.Cor:RRR       Assessment & Plan:  Obesity Need for flu vaccine.  Plan: Rx for phentermine 37.5 mg #30

## 2017-04-29 NOTE — Patient Instructions (Signed)
Rx for Phentermine. Flu vaccine given

## 2017-11-04 DIAGNOSIS — Z6835 Body mass index (BMI) 35.0-35.9, adult: Secondary | ICD-10-CM | POA: Diagnosis not present

## 2017-11-04 DIAGNOSIS — Z01419 Encounter for gynecological examination (general) (routine) without abnormal findings: Secondary | ICD-10-CM | POA: Diagnosis not present

## 2017-12-31 ENCOUNTER — Encounter: Payer: Self-pay | Admitting: Internal Medicine

## 2017-12-31 DIAGNOSIS — Z1231 Encounter for screening mammogram for malignant neoplasm of breast: Secondary | ICD-10-CM | POA: Diagnosis not present

## 2018-01-04 ENCOUNTER — Other Ambulatory Visit: Payer: Self-pay | Admitting: Internal Medicine

## 2018-01-04 DIAGNOSIS — E559 Vitamin D deficiency, unspecified: Secondary | ICD-10-CM

## 2018-01-04 DIAGNOSIS — Z0000 Encounter for general adult medical examination without abnormal findings: Secondary | ICD-10-CM

## 2018-01-04 DIAGNOSIS — Z1329 Encounter for screening for other suspected endocrine disorder: Secondary | ICD-10-CM

## 2018-01-04 DIAGNOSIS — E7800 Pure hypercholesterolemia, unspecified: Secondary | ICD-10-CM

## 2018-01-06 ENCOUNTER — Other Ambulatory Visit: Payer: BLUE CROSS/BLUE SHIELD | Admitting: Internal Medicine

## 2018-01-06 DIAGNOSIS — E559 Vitamin D deficiency, unspecified: Secondary | ICD-10-CM

## 2018-01-06 DIAGNOSIS — E7800 Pure hypercholesterolemia, unspecified: Secondary | ICD-10-CM | POA: Diagnosis not present

## 2018-01-06 DIAGNOSIS — Z0000 Encounter for general adult medical examination without abnormal findings: Secondary | ICD-10-CM | POA: Diagnosis not present

## 2018-01-06 DIAGNOSIS — Z1329 Encounter for screening for other suspected endocrine disorder: Secondary | ICD-10-CM

## 2018-01-07 LAB — CBC WITH DIFFERENTIAL/PLATELET
Basophils Absolute: 20 cells/uL (ref 0–200)
Basophils Relative: 0.4 %
Eosinophils Absolute: 168 cells/uL (ref 15–500)
Eosinophils Relative: 3.3 %
HEMATOCRIT: 38.4 % (ref 35.0–45.0)
Hemoglobin: 12.6 g/dL (ref 11.7–15.5)
LYMPHS ABS: 2606 {cells}/uL (ref 850–3900)
MCH: 26 pg — ABNORMAL LOW (ref 27.0–33.0)
MCHC: 32.8 g/dL (ref 32.0–36.0)
MCV: 79.2 fL — ABNORMAL LOW (ref 80.0–100.0)
MPV: 9.8 fL (ref 7.5–12.5)
Monocytes Relative: 8.8 %
NEUTROS PCT: 36.4 %
Neutro Abs: 1856 cells/uL (ref 1500–7800)
Platelets: 332 10*3/uL (ref 140–400)
RBC: 4.85 10*6/uL (ref 3.80–5.10)
RDW: 12.8 % (ref 11.0–15.0)
Total Lymphocyte: 51.1 %
WBC: 5.1 10*3/uL (ref 3.8–10.8)
WBCMIX: 449 {cells}/uL (ref 200–950)

## 2018-01-07 LAB — LIPID PANEL
Cholesterol: 237 mg/dL — ABNORMAL HIGH (ref <200)
HDL: 50 mg/dL — ABNORMAL LOW (ref >50)
LDL CHOLESTEROL (CALC): 170 mg/dL — AB
Non-HDL Cholesterol (Calc): 187 mg/dL (calc) — ABNORMAL HIGH (ref <130)
Total CHOL/HDL Ratio: 4.7 (calc) (ref <5.0)
Triglycerides: 73 mg/dL (ref <150)

## 2018-01-07 LAB — COMPLETE METABOLIC PANEL WITH GFR
AG Ratio: 1.3 (calc) (ref 1.0–2.5)
ALT: 10 U/L (ref 6–29)
AST: 14 U/L (ref 10–35)
Albumin: 4.1 g/dL (ref 3.6–5.1)
Alkaline phosphatase (APISO): 88 U/L (ref 33–130)
BUN: 15 mg/dL (ref 7–25)
CALCIUM: 9.6 mg/dL (ref 8.6–10.4)
CO2: 26 mmol/L (ref 20–32)
CREATININE: 0.74 mg/dL (ref 0.50–1.05)
Chloride: 106 mmol/L (ref 98–110)
GFR, EST NON AFRICAN AMERICAN: 91 mL/min/{1.73_m2} (ref >=60)
GFR, Est African American: 106 mL/min/{1.73_m2} (ref >=60)
GLUCOSE: 90 mg/dL (ref 65–99)
Globulin: 3.1 g/dL (calc) (ref 1.9–3.7)
Potassium: 4.2 mmol/L (ref 3.5–5.3)
Sodium: 141 mmol/L (ref 135–146)
Total Bilirubin: 0.4 mg/dL (ref 0.2–1.2)
Total Protein: 7.2 g/dL (ref 6.1–8.1)

## 2018-01-07 LAB — VITAMIN D 25 HYDROXY (VIT D DEFICIENCY, FRACTURES): Vit D, 25-Hydroxy: 19 ng/mL — ABNORMAL LOW (ref 30–100)

## 2018-01-07 LAB — TSH: TSH: 1.94 m[IU]/L

## 2018-01-13 ENCOUNTER — Other Ambulatory Visit: Payer: BLUE CROSS/BLUE SHIELD | Admitting: Internal Medicine

## 2018-03-24 ENCOUNTER — Encounter: Payer: Self-pay | Admitting: Internal Medicine

## 2018-03-24 ENCOUNTER — Ambulatory Visit (INDEPENDENT_AMBULATORY_CARE_PROVIDER_SITE_OTHER): Payer: BLUE CROSS/BLUE SHIELD | Admitting: Internal Medicine

## 2018-03-24 VITALS — BP 120/90 | HR 78 | Temp 98.0°F | Ht 58.5 in | Wt 174.0 lb

## 2018-03-24 DIAGNOSIS — Z23 Encounter for immunization: Secondary | ICD-10-CM | POA: Diagnosis not present

## 2018-03-24 DIAGNOSIS — R05 Cough: Secondary | ICD-10-CM

## 2018-03-24 DIAGNOSIS — Z0000 Encounter for general adult medical examination without abnormal findings: Secondary | ICD-10-CM

## 2018-03-24 DIAGNOSIS — E7800 Pure hypercholesterolemia, unspecified: Secondary | ICD-10-CM | POA: Diagnosis not present

## 2018-03-24 DIAGNOSIS — J22 Unspecified acute lower respiratory infection: Secondary | ICD-10-CM

## 2018-03-24 DIAGNOSIS — E559 Vitamin D deficiency, unspecified: Secondary | ICD-10-CM | POA: Diagnosis not present

## 2018-03-24 DIAGNOSIS — R059 Cough, unspecified: Secondary | ICD-10-CM

## 2018-03-24 LAB — POCT URINALYSIS DIPSTICK
Appearance: NORMAL
Bilirubin, UA: NEGATIVE
Blood, UA: NEGATIVE
Glucose, UA: NEGATIVE
Ketones, UA: NEGATIVE
LEUKOCYTES UA: NEGATIVE
NITRITE UA: NEGATIVE
Odor: NORMAL
PROTEIN UA: NEGATIVE
SPEC GRAV UA: 1.015 (ref 1.010–1.025)
Urobilinogen, UA: 0.2 E.U./dL
pH, UA: 6 (ref 5.0–8.0)

## 2018-03-24 MED ORDER — LEVOFLOXACIN 500 MG PO TABS: 500.0000 mg | ORAL_TABLET | Freq: Every day | ORAL | 0 refills | Status: DC

## 2018-03-24 NOTE — Progress Notes (Signed)
Subjective:    Patient ID: Victoria Bush, female    DOB: 04/10/1961, 57 y.o.   MRN: [id number]  HPI 57 year old Female for health maintenance exam and evaluation of medical issues.   General health is good.  Past medical history: Total abdominal hysterectomy BSO for uterine fibroids by Dr. Gaetano Net in 2014.  Total cholesterol has increased from 2 18-2 37.  LDL cholesterol has increased from 153 to 170.  Not watching diet as closely as she should.  Vitamin D level is low at 19 and was 20 a year ago.  Needs to take 2000 units vitamin D3 daily.  No known drug allergies  Remote history of benign breast biopsies in left and right breast.  Left breast biopsy was at age 50 and right breast biopsy was in 2003.  In 2007 she had right lower lobe pneumonia.  History of allergic rhinitis and had mild reactions to mold on testing in 2008.  Was placed on Nasonex Clarinex and Optivar eyedrops by allergist.  Family history: Both parents deceased at age 74 due to cancer.  Patient says father had esophageal cancer mother had throat cancer.  Sister with history of migraine headaches and impaired glucose tolerance as well as mental issues.  Sister lives with her.  Social history: She is married.  Has a high school education.  Resides with her husband and sister in [place].  Does not smoke.  Sometimes consumes alcohol but not very often.  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  Remote history of migraine headaches treated with Topamax by neurologist.  History of uterine myomectomy twice around 2004 before hysterectomy.  Has seen Dr. Henrene Pastor in the past for history of throat clearing globus sensation and pyrosis likely related to reflux disease treated with PPI in 2006.  Colonoscopy 2017 with instructions to repeat in 10 years.  This was done by Dr. Fuller Plan.  Was seen by Dr. Elie Goody, ENT physician for ear pain related to TMJ  dysfunction in 2009.  Was given Pneumovax 23 in 2007 for history of pneumonia.  No known drug allergies but hydrocodone causes nausea.      Review of Systems recent respiratory infection symptoms with cough.  No fever or chills.     Objective:   Physical Exam  Constitutional: She is oriented to person, place, and time. She appears well-developed and well-nourished. No distress.  HENT:  Head: Normocephalic and atraumatic.  Right Ear: External ear normal.  Left Ear: External ear normal.  Mouth/Throat: Oropharynx is clear and moist. No oropharyngeal exudate.  Eyes: Pupils are equal, round, and reactive to light. Conjunctivae and EOM are normal. Right eye exhibits no discharge. Left eye exhibits no discharge.  Neck: Neck supple. No JVD present. No thyromegaly present.  Cardiovascular: Normal rate, regular rhythm, normal heart sounds and intact distal pulses.  No murmur heard. Pulmonary/Chest: Effort normal and breath sounds normal. No stridor. No respiratory distress. She has no wheezes. She has no rales.  Breasts normal female without masses  Abdominal: Soft. She exhibits no distension and no mass. There is no tenderness. There is no rebound and no guarding.  Genitourinary:  Genitourinary Comments: Deferred to GYN physician.  Status post TAH/BSO  Musculoskeletal: She exhibits no edema.  Lymphadenopathy:    She has no cervical adenopathy.  Neurological: She is alert and oriented to person, place, and time. She displays normal reflexes. No cranial nerve deficit.  She exhibits normal muscle tone. Coordination normal.  Skin: Skin is warm. She is not diaphoretic.  Psychiatric: She has a normal mood and affect. Her behavior is normal. Judgment and thought content normal.  Vitals reviewed.         Assessment & Plan:  Acute lower respiratory infection treated with Levaquin 500 mg daily for 7 days.  History of allergic rhinitis  History of TMJ dysfunction  Pure  hypercholesterolemia-repeat lipid panel in 6 months.  Work on diet and exercise  Status post total abdominal hysterectomy BSO for fibroids 2012  Vitamin D deficiency  Plan: Return in 6 months for follow-up of vitamin D deficiency and hyperlipidemia.

## 2018-04-02 NOTE — Patient Instructions (Signed)
Watch diet and try to get more exercise.  Follow-up with lipid panel in 6 months.  Take 2000 units vitamin D3 daily.  Levaquin 500 mg daily for 7 days for acute respiratory infection.

## 2018-09-21 ENCOUNTER — Telehealth: Payer: Self-pay | Admitting: Internal Medicine

## 2018-09-21 NOTE — Telephone Encounter (Signed)
Victoria Bush called to cancel labs an 6 month OV due to COVID-19, she did not want to reschedule at this time, will CB when everything gets better to reschedule

## 2018-09-22 ENCOUNTER — Other Ambulatory Visit: Payer: BLUE CROSS/BLUE SHIELD | Admitting: Internal Medicine

## 2018-09-29 ENCOUNTER — Ambulatory Visit: Payer: BLUE CROSS/BLUE SHIELD | Admitting: Internal Medicine

## 2018-11-09 DIAGNOSIS — Z01419 Encounter for gynecological examination (general) (routine) without abnormal findings: Secondary | ICD-10-CM | POA: Diagnosis not present

## 2018-11-09 DIAGNOSIS — Z6835 Body mass index (BMI) 35.0-35.9, adult: Secondary | ICD-10-CM | POA: Diagnosis not present

## 2019-01-13 LAB — HM MAMMOGRAPHY

## 2019-01-15 ENCOUNTER — Encounter: Payer: Self-pay | Admitting: Internal Medicine

## 2019-04-10 ENCOUNTER — Telehealth: Payer: Self-pay | Admitting: Internal Medicine

## 2019-04-10 NOTE — Telephone Encounter (Signed)
LVM to call office and schedule CPE and Labs or 6 month recheck that was canceled from April.

## 2020-01-26 LAB — HM MAMMOGRAPHY

## 2020-01-28 ENCOUNTER — Encounter: Payer: Self-pay | Admitting: Internal Medicine

## 2020-02-14 ENCOUNTER — Encounter: Payer: Self-pay | Admitting: Internal Medicine

## 2020-02-25 ENCOUNTER — Other Ambulatory Visit: Payer: Self-pay | Admitting: Radiology

## 2020-02-25 ENCOUNTER — Encounter: Payer: Self-pay | Admitting: Internal Medicine

## 2021-03-24 ENCOUNTER — Other Ambulatory Visit: Payer: Self-pay

## 2021-03-24 ENCOUNTER — Telehealth: Payer: Self-pay | Admitting: Internal Medicine

## 2021-03-24 ENCOUNTER — Encounter (HOSPITAL_COMMUNITY): Payer: Self-pay | Admitting: Emergency Medicine

## 2021-03-24 ENCOUNTER — Ambulatory Visit (HOSPITAL_COMMUNITY)
Admission: EM | Admit: 2021-03-24 | Discharge: 2021-03-24 | Disposition: A | Discharge status: Home / Self Care | Attending: Student | Admitting: Student

## 2021-03-24 ENCOUNTER — Ambulatory Visit: Admitting: Internal Medicine

## 2021-03-24 DIAGNOSIS — Z20822 Contact with and (suspected) exposure to covid-19: Secondary | ICD-10-CM | POA: Insufficient documentation

## 2021-03-24 DIAGNOSIS — R030 Elevated blood-pressure reading, without diagnosis of hypertension: Secondary | ICD-10-CM | POA: Insufficient documentation

## 2021-03-24 DIAGNOSIS — H6593 Unspecified nonsuppurative otitis media, bilateral: Secondary | ICD-10-CM | POA: Diagnosis not present

## 2021-03-24 MED ORDER — PREDNISONE 20 MG PO TABS: 20.0000 mg | ORAL_TABLET | Freq: Every day | ORAL | 0 refills | Status: AC

## 2021-03-24 MED ORDER — PROMETHAZINE-DM 6.25-15 MG/5ML PO SYRP: 5.0000 mL | ORAL_SOLUTION | Freq: Four times a day (QID) | ORAL | 0 refills | Status: DC | PRN

## 2021-03-24 NOTE — Telephone Encounter (Signed)
Called patient back, because she had not returned my call, she stated she does not want to do home COVID test she would rather go to Urgent Care where they do it all there.

## 2021-03-24 NOTE — ED Triage Notes (Signed)
Patient here for a cough since Thursday. Was exposed to a positive Covid person. Ears feel full.

## 2021-03-24 NOTE — Discharge Instructions (Addendum)
-  Prednisone one pill with breakfast x5 days -Promethazine DM cough syrup for congestion/cough. This could make you drowsy, so take at night before bed. -Please check your blood pressure at home or at the pharmacy. If this continues to be >140/90, follow-up with your primary care provider for further blood pressure management/ medication titration. If you develop chest pain, shortness of breath, vision changes, the worst headache of your life- head straight to the ED or call 911.

## 2021-03-24 NOTE — Telephone Encounter (Addendum)
Victoria Bush [phone]  [name] called she started with cough and runny nose on Saturday, she done a COVID test then and it was negative. I have ask her to do another one and call back with results.  Last seen 03/24/2018, canceled 09/21/2018 and did not schedule CPE due 03/26/2019 due to [place] pandemic  Your name has been removed as PCP.

## 2021-03-24 NOTE — Telephone Encounter (Signed)
LVM to CB with COVID results and that I had scheduled her for 4:00 and pending COVID results what type of visit it would be.

## 2021-03-24 NOTE — ED Provider Notes (Signed)
Victoria Bush    CSN: [id number] Arrival date & time: 03/24/21  1832      History   Chief Complaint Chief Complaint  Patient presents with   Cough   Ear Fullness    HPI Victoria Bush is a 60 y.o. female presenting with viral syndrome x6 days following exposure to covid.  Medical history noncontributory.  Initially with cough, congestion; the symptoms have largely resolved, though dry cough persists.  Denies shortness of breath, wheezing, fever/chills.  Does note bilateral ear fullness and pressure, without hearing changes, dizziness, tinnitus.  HPI  Past Medical History:  Diagnosis Date   No pertinent past medical history     Patient Active Problem List   Diagnosis Date Noted   Vitamin D deficiency 08/05/2015   Hyperlipidemia 08/05/2015   Allergic rhinitis 08/05/2015    Past Surgical History:  Procedure Laterality Date   ABDOMINAL HYSTERECTOMY  06/19/2012   Procedure: HYSTERECTOMY ABDOMINAL;  Surgeon: Allena Katz, MD;  Location: [facility];  Service: Gynecology;  Laterality: N/A;   breast cystectomy bilateral     MYOMECTOMY ABDOMINAL APPROACH     x2   SALPINGOOPHORECTOMY  06/19/2012   Procedure: SALPINGO OOPHORECTOMY;  Surgeon: Shon Millet II, MD;  Location: [facility];  Service: Gynecology;  Laterality: Bilateral;   WISDOM TOOTH EXTRACTION      OB History   No obstetric history on file.      Home Medications    Prior to Admission medications   Medication Sig Start Date End Date Taking? Authorizing Provider  predniSONE (DELTASONE) 20 MG tablet Take 1 tablet (20 mg total) by mouth daily for 5 days. 03/24/21 03/29/21 Yes Hazel Sams, PA-C  promethazine-dextromethorphan (PROMETHAZINE-DM) 6.25-15 MG/5ML syrup Take 5 mLs by mouth 4 (four) times daily as needed for cough. 03/24/21  Yes Hazel Sams, PA-C  b complex vitamins tablet Take 1 tablet by mouth daily.    [provider]  BIOTIN FORTE PO Take by mouth daily.    [provider]  cholecalciferol (VITAMIN D) 1000 units tablet Take 1,000 Units by mouth daily.    [provider]  ferrous sulfate 325 (65 FE) MG tablet Take 325 mg by mouth daily with breakfast.    [provider]  levofloxacin (LEVAQUIN) 500 MG tablet Take 1 tablet (500 mg total) by mouth daily. 03/24/18   Elby Showers, MD  Omega-3 Fatty Acids (FISH OIL) 1200 MG CAPS Take 1,200 mg by mouth daily.    [provider]  vitamin C (ASCORBIC ACID) 500 MG tablet Take 500 mg by mouth daily.    [provider]  vitamin E 1000 UNIT capsule Take 1,000 Units by mouth daily.    [provider]    Family History Family History  Problem Relation Age of Onset   Cancer Mother    Mental illness Sister    Colon cancer Neg Hx    Colon polyps Neg Hx     Social History Social History   Tobacco Use   Smoking status: Never   Smokeless tobacco: Never  Substance Use Topics   Alcohol use: Yes    Alcohol/week: 0.0 standard drinks    Comment: rarely   Drug use: No     Allergies   Patient has no known allergies.   Review of Systems Review of Systems  Constitutional:  Negative for appetite change, chills and fever.  HENT:  Positive for congestion and ear pain. Negative for rhinorrhea,  sinus pressure, sinus pain and sore throat.   Eyes:  Negative for redness and visual disturbance.  Respiratory:  Negative for cough, chest tightness, shortness of breath and wheezing.   Cardiovascular:  Negative for chest pain and palpitations.  Gastrointestinal:  Negative for abdominal pain, constipation, diarrhea, nausea and vomiting.  Genitourinary:  Negative for dysuria, frequency and urgency.  Musculoskeletal:  Negative for myalgias.  Neurological:  Negative for dizziness, weakness and headaches.  Psychiatric/Behavioral:  Negative for confusion.   All other systems reviewed and are negative.   Physical Exam Triage Vital Signs ED Triage Vitals  Enc Vitals Group      BP 03/24/21 1931 (!) 187/106     Pulse Rate 03/24/21 1931 83     Resp 03/24/21 1931 19     Temp 03/24/21 1931 98 F (36.7 C)     Temp Source 03/24/21 1931 Oral     SpO2 03/24/21 1931 96 %     Weight --      Height --      Head Circumference --      Peak Flow --      Pain Score 03/24/21 1928 0     Pain Loc --      Pain Edu? --      Excl. in [place]? --    No data found.  Updated Vital Signs BP (!) 187/106 (BP Location: Left Arm)   Pulse 83   Temp 98 F (36.7 C) (Oral)   Resp 19   SpO2 96%   Visual Acuity Right Eye Distance:   Left Eye Distance:   Bilateral Distance:    Right Eye Near:   Left Eye Near:    Bilateral Near:     Physical Exam Vitals reviewed.  Constitutional:      Appearance: Normal appearance. She is not ill-appearing.  HENT:     Head: Normocephalic and atraumatic.     Right Ear: Hearing, ear canal and external ear normal. No swelling or tenderness. A middle ear effusion is present. There is no impacted cerumen. No mastoid tenderness. Tympanic membrane is not injected, scarred, perforated, erythematous, retracted or bulging.     Left Ear: Hearing, ear canal and external ear normal. No swelling or tenderness. A middle ear effusion is present. There is no impacted cerumen. No mastoid tenderness. Tympanic membrane is not injected, scarred, perforated, erythematous, retracted or bulging.     Mouth/Throat:     Pharynx: Oropharynx is clear. No oropharyngeal exudate or posterior oropharyngeal erythema.  Cardiovascular:     Rate and Rhythm: Normal rate and regular rhythm.     Heart sounds: Normal heart sounds.  Pulmonary:     Effort: Pulmonary effort is normal.     Breath sounds: Normal breath sounds.  Lymphadenopathy:     Cervical: No cervical adenopathy.  Neurological:     General: No focal deficit present.     Mental Status: She is alert and oriented to person, place, and time.  Psychiatric:        Mood and Affect: Mood normal.        Behavior:  Behavior normal.        Thought Content: Thought content normal.        Judgment: Judgment normal.     UC Treatments / Results  Labs (all labs ordered are listed, but only abnormal results are displayed) Labs Reviewed  SARS CORONAVIRUS 2 (TAT 6-24 HRS)    EKG   Radiology No results found.  Procedures Procedures (including  critical care time)  Medications Ordered in UC Medications - No data to display  Initial Impression / Assessment and Plan / UC Course  I have reviewed the triage vital signs and the nursing notes.  Pertinent labs & imaging results that were available during my care of the patient were reviewed by me and considered in my medical decision making (see chart for details).     This patient is a very pleasant 60 y.o. year old female presenting with mid ear effusion following covid-19. Today this pt is afebrile nontachycardic nontachypneic, oxygenating well on room air, no wheezes rhonchi or rales. .   Exposure to covid. Covid PCR sent. She is vaccinated .  Low-dose Prednisone, promethazine DM sent for symptomatic relief.  Blood pressure is elevated today, monitor this at home.  Denies headaches, dizziness, chest pain, vision changes.  ED return precautions discussed. Patient verbalizes understanding and agreement.   Final Clinical Impressions(s) / UC Diagnoses   Final diagnoses:  Fluid level behind tympanic membrane of both ears  Exposure to COVID-19 virus  Suspected COVID-19 virus infection  Elevated blood-pressure reading without diagnosis of hypertension     Discharge Instructions      -Prednisone one pill with breakfast x5 days -Promethazine DM cough syrup for congestion/cough. This could make you drowsy, so take at night before bed. -Please check your blood pressure at home or at the pharmacy. If this continues to be >140/90, follow-up with your primary care provider for further blood pressure management/ medication titration. If you develop  chest pain, shortness of breath, vision changes, the worst headache of your life- head straight to the ED or call 911.      ED Prescriptions     Medication Sig Dispense Auth. Provider   predniSONE (DELTASONE) 20 MG tablet Take 1 tablet (20 mg total) by mouth daily for 5 days. 5 tablet Hazel Sams, PA-C   promethazine-dextromethorphan (PROMETHAZINE-DM) 6.25-15 MG/5ML syrup Take 5 mLs by mouth 4 (four) times daily as needed for cough. 118 mL Hazel Sams, PA-C      PDMP not reviewed this encounter.   Hazel Sams, PA-C 03/24/21 2020

## 2021-03-24 NOTE — Telephone Encounter (Signed)
scheduled

## 2021-03-25 LAB — SARS CORONAVIRUS 2 (TAT 6-24 HRS): SARS Coronavirus 2: NEGATIVE

## 2021-03-25 NOTE — Telephone Encounter (Signed)
schedule

## 2021-03-25 NOTE — Telephone Encounter (Signed)
Victoria Bush called back to say she went to the UC yesterday and they said she needed to follow up with her PCP, for her blood pressure. She could not remember what it was, and she does not have results of COVID test yet.  Last seen in our office 04/03/2018

## 2021-03-27 ENCOUNTER — Ambulatory Visit: Admitting: Internal Medicine

## 2021-04-03 ENCOUNTER — Other Ambulatory Visit: Payer: Self-pay

## 2021-04-03 ENCOUNTER — Encounter: Payer: Self-pay | Admitting: Internal Medicine

## 2021-04-03 ENCOUNTER — Ambulatory Visit (INDEPENDENT_AMBULATORY_CARE_PROVIDER_SITE_OTHER): Admitting: Internal Medicine

## 2021-04-03 VITALS — BP 130/84 | HR 72 | Temp 98.5°F | Ht 58.5 in | Wt 173.0 lb

## 2021-04-03 DIAGNOSIS — Z0131 Encounter for examination of blood pressure with abnormal findings: Secondary | ICD-10-CM

## 2021-04-03 DIAGNOSIS — Z23 Encounter for immunization: Secondary | ICD-10-CM

## 2021-04-03 NOTE — Progress Notes (Signed)
   Subjective:    Patient ID: Ollen Bowl, female    DOB: [birth year], 60 y.o.   MRN: [id number]  HPI 60 year old Female last seen in 2019 for health maintenance exam.  She went to urgent care on October 18 for viral syndrome after COVID exposure.  Had some cough congestion but no shortness of breath or wheezing.  Was having some ear fullness.  Was prescribed prednisone 20 mg daily for 5 days.  Symptoms improved.  She has had at least 3 COVID vaccines and should consider booster in the near future.  She request flu vaccine today.  This was given.  She is working for [employer] in housekeeping.  She likes her job.    Review of Systems     Objective:   Physical Exam Blood pressure 130/84 pulse 72 temperature 98.5 degrees pulse oximetry 97% weight 173 pounds BMI 35.54 her chest is clear to auscultation.  Cardiac exam: Regular rate and rhythm.  TMs are clear.       Assessment & Plan:  Recent respiratory infection treated with 5 days of prednisone 20 mg daily at urgent care.  Need for flu vaccine  Plan: She will schedule a health maintenance exam sometime in the future.  Flu vaccine given today.

## 2021-04-04 NOTE — Patient Instructions (Signed)
Flu vaccine given.

## 2021-04-27 ENCOUNTER — Telehealth: Payer: Self-pay

## 2021-04-27 NOTE — Telephone Encounter (Signed)
Appt made. Patient is to call in the morning with covid test results.

## 2021-04-27 NOTE — Telephone Encounter (Signed)
Patient calling asking for another appointment. She states that she was seen 10/18 and given 5 days prednisone and covid test was negative. Came to office 10/28 (covid negative that day) still with cough and fluid in ears. She states that she still has a cough and the pressure in her ears and thinks she needs reevaluated. Please advise. No other symptoms per pt.

## 2021-04-28 ENCOUNTER — Encounter: Payer: Self-pay | Admitting: Internal Medicine

## 2021-04-28 ENCOUNTER — Other Ambulatory Visit: Payer: Self-pay

## 2021-04-28 ENCOUNTER — Ambulatory Visit (INDEPENDENT_AMBULATORY_CARE_PROVIDER_SITE_OTHER): Admitting: Internal Medicine

## 2021-04-28 VITALS — BP 128/78 | HR 74 | Temp 98.4°F

## 2021-04-28 DIAGNOSIS — H6503 Acute serous otitis media, bilateral: Secondary | ICD-10-CM | POA: Diagnosis not present

## 2021-04-28 DIAGNOSIS — R059 Cough, unspecified: Secondary | ICD-10-CM

## 2021-04-28 DIAGNOSIS — R053 Chronic cough: Secondary | ICD-10-CM

## 2021-04-28 MED ORDER — LEVOFLOXACIN 500 MG PO TABS: 500.0000 mg | ORAL_TABLET | Freq: Every day | ORAL | 0 refills | Status: AC

## 2021-04-28 MED ORDER — METHYLPREDNISOLONE ACETATE 80 MG/ML IJ SUSP
80.0000 mg | Freq: Once | INTRAMUSCULAR | Status: AC
  Administered 2021-04-28: 80 mg via INTRAMUSCULAR

## 2021-04-28 MED ORDER — BENZONATATE 100 MG PO CAPS: 100.0000 mg | ORAL_CAPSULE | Freq: Three times a day (TID) | ORAL | 0 refills | Status: DC | PRN

## 2021-04-28 NOTE — Progress Notes (Signed)
   Subjective:    Patient ID: Victoria Bush, female    DOB: Jun 17, 1960, 60 y.o.   MRN: [id number]  HPI 60 year old Female seen today for persistent cough and pressure in her ears.  She was seen at urgent care on October 18 for a viral syndrome after COVID exposure.  She had some cough and congestion but no shortness of breath or wheezing.  Was having some ear fullness.  Was prescribed prednisone 20 mg daily for 5 days and symptoms improved.  She was seen on October 28 here.  She received flu vaccine.  She was feeling better.  She called on November 21 requesting another appointment.  Still complaining of cough and pressure in her ears.  Asking to be reevaluated.  She is very careful about COVID exposure.  She has had at least 3 COVID vaccines.  In 2007 she had right lower lobe pneumonia.  History of vitamin D deficiency.  History of pure hypercholesterolemia.  Patient had total abdominal hysterectomy BSO for uterine fibroids by Dr. Gertie Fey in 2014.  History of allergic rhinitis and had mild reactions to mold on testing in 2008.  Social history: She is married.  Does not smoke.  She works for [employer].  Remote history of migraine headaches.  Had colonoscopy in 2017 with repeat study to be done in 10 years.  History of TMJ dysfunction and ear pain in 2009 seen by ENT physician.  No known drug allergies but hydrocodone causes nausea.   Review of Systems     Objective:   Physical Exam Temperature 98.4 degrees pulse oximetry 97% pulse 74 blood pressure 128/78  Skin warm and dry.  No cervical adenopathy.  TMs are full but not red.  Pharynx is clear.  Neck is supple.  Chest clear.       Assessment & Plan:  Protracted cough  Bilateral serous otitis media  Plan: Patient given Depo-Medrol 80 mg IM.  Given Tessalon Perles 100 mg up to 3 times daily as needed for cough.  Take Levaquin 500 mg daily for 7 days.  Call if not better in 10 days or sooner if worse.

## 2021-05-18 ENCOUNTER — Telehealth: Payer: Self-pay | Admitting: *Deleted

## 2021-05-18 NOTE — Telephone Encounter (Signed)
Spoke with the patient and scheduled a new patient appt with Dr Berline Lopes on 06/12/21 at 9 am. Patient given the arrival time of 8:30 am. Patient also given the address and phone number for the clinic, along with the policy for mask and visitors

## 2021-06-06 NOTE — Patient Instructions (Addendum)
Depo-Medrol 80 mg IM given today for serous otitis media and cough.  Take Levaquin 500 mg daily for 7 days.  Call if not better in 10 days or sooner if worse.  Take Tessalon Perles as needed for cough.

## 2021-06-10 ENCOUNTER — Encounter: Payer: Self-pay | Admitting: Gynecologic Oncology

## 2021-06-12 ENCOUNTER — Other Ambulatory Visit: Payer: Self-pay

## 2021-06-12 ENCOUNTER — Encounter: Payer: Self-pay | Admitting: Gynecologic Oncology

## 2021-06-12 ENCOUNTER — Telehealth: Payer: Self-pay | Admitting: Gynecologic Oncology

## 2021-06-12 ENCOUNTER — Inpatient Hospital Stay: Attending: Gynecologic Oncology | Admitting: Gynecologic Oncology

## 2021-06-12 ENCOUNTER — Telehealth: Payer: Self-pay | Admitting: Oncology

## 2021-06-12 VITALS — BP 159/98 | HR 101 | Temp 97.7°F | Resp 18 | Ht <= 58 in | Wt 174.0 lb

## 2021-06-12 DIAGNOSIS — Z87410 Personal history of cervical dysplasia: Secondary | ICD-10-CM | POA: Insufficient documentation

## 2021-06-12 DIAGNOSIS — Z90722 Acquired absence of ovaries, bilateral: Secondary | ICD-10-CM | POA: Insufficient documentation

## 2021-06-12 DIAGNOSIS — N893 Dysplasia of vagina, unspecified: Secondary | ICD-10-CM | POA: Diagnosis present

## 2021-06-12 DIAGNOSIS — Z90710 Acquired absence of both cervix and uterus: Secondary | ICD-10-CM | POA: Diagnosis not present

## 2021-06-12 NOTE — Patient Instructions (Signed)
It was very nice to meet you today.  I took a biopsy from the top of the vagina where I see some changes that appear consistent with HPV (human papilloma virus) related changes.  I do not see or feel anything that looks concerning for cancer.  My suspicion is that biopsy may show some precancerous changes.  I will call you when I have the biopsy results back next week.  We discussed possible biopsy results today.  If biopsy is negative for any precancer, then we will plan to repeat a Pap test in 6 months if the HPV testing from your recent Pap smear comes back negative.  If biopsy shows low-grade precancerous changes, we will plan to repeat a Pap and HPV testing in 1 year.  If biopsy result shows high-grade precancerous changes, then we will discuss further treatment for this (would either be laser/burning precancerous tissue versus removing/excising it).  Please not hesitate to call if you have any questions.  Our office number is [phone].

## 2021-06-12 NOTE — Telephone Encounter (Signed)
In error

## 2021-06-12 NOTE — Telephone Encounter (Signed)
Requested HPV testing on accession [id number] with [facility]. They will fax the results to [place] when completed.

## 2021-06-12 NOTE — Progress Notes (Signed)
GYNECOLOGIC ONCOLOGY NEW PATIENT CONSULTATION   Patient Name: Victoria Bush  Patient Age: 61 y.o. Date of Service: 06/12/2021 Referring Provider: Dr. Everlene Farrier  Primary Care Provider: Renold Genta Cresenciano Lick, MD Consulting Provider: Jeral Pinch, MD   Assessment/Plan:  Postmenopausal patient with remote history of cervical dysplasia and recent discrepancy between high-grade vaginal Pap and benign vaginal biopsy.  Discussed recent pap test results as well as biopsy. There does not appear to have been HPV testing on her recent pap test. I have asked my office to call pathology to see if this can be added.  On exam today, there are findings on vaginoscopy that are concerning for low grade dysplasia. Biopsy performed today from this area. I will call the patient when results are back next week.  Discussed very likely history of HPV-related cervical dysplasia.  If biopsy today shows high-grade dysplasia, we reviewed treatment options including laser ablation and partial vaginectomy. If biopsy is benign or shows low-grade dysplasia, then we will await HPV testing and plan to proceed with close surveillance.    A copy of this note was sent to the patient's referring provider.   65 minutes of total time was spent for this patient encounter, including preparation, face-to-face counseling with the patient and coordination of care, and documentation of the encounter.   Jeral Pinch, MD  Division of Gynecologic Oncology  Department of Obstetrics and Gynecology  [facility]  ___________________________________________  Chief Complaint: Chief Complaint  Patient presents with   VAIN (vaginal intraepithelial neoplasia)    History of Present Illness:  Victoria Bush is a 61 y.o. y.o. female who is seen in consultation at the request of Dr. Gaetano Net for an evaluation of discrepant Pap and vaginal biopsy, high-grade intraepithelial lesion by Pap testing, remote  history of cervical dysplasia.  Patient was seen for her annual visit on 11/21.  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  Patient was then seen for colposcopy on 12/2 with findings of some acetowhite changes and biopsy performed.  Biopsy with a diagnosis of benign squamous mucosa, no atypia, dysplasia, or malignancy.  Patient endorses a remote history of abnormal Pap smears in her teens.  She notes at one point having cryotherapy.  Subsequent Pap smears for many years were normal.  She underwent total abdominal hysterectomy with BSO in 2014 for uterine fibroids.  Findings at the time of surgery were also pertinent for endometriosis, confirmed on final pathology.  Patient denies any vaginal bleeding or discharge since her hysterectomy.  She denies any pelvic pain.  She reports regular bowel and bladder function.  She endorses a good appetite although does not eat much at baseline.  She denies any weight changes recently but is trying to lose weight.  She denies any nausea or emesis.  Patient lives in [place] with her husband and sister.  She works part-time doing Physiological scientist.  PAST MEDICAL HISTORY:  Past Medical History:  Diagnosis Date   BMI 34.0-34.9,adult    Migraine    no aura; lifetime history     PAST SURGICAL HISTORY:  Past Surgical History:  Procedure Laterality Date   ABDOMINAL HYSTERECTOMY  06/19/2012   Procedure: HYSTERECTOMY ABDOMINAL;  Surgeon: Allena Katz, MD;  Location: [facility];  Service: Gynecology;  Laterality: N/A;   breast cystectomy bilateral     MYOMECTOMY ABDOMINAL APPROACH     x2, laparotomy   SALPINGOOPHORECTOMY  06/19/2012   Procedure: SALPINGO OOPHORECTOMY;  Surgeon: Allena Katz, MD;  Location: [facility];  Service: Gynecology;  Laterality: Bilateral;   WISDOM TOOTH EXTRACTION      OB/GYN HISTORY:  OB History  Gravida Para Term Preterm AB Living  0 0 0 0 0  0  SAB IAB Ectopic Multiple Live Births  0 0 0 0 0    No LMP recorded. Patient has had a hysterectomy.  Age at menarche: 46 Age at menopause: 75 Hx of HRT: Denies Hx of STDs: Denies, although I suspect has a remote history of HPV Last pap: See HPI.  Prior to Pap smears were in 2019 and 2016 History of abnormal pap smears: Yes, see HPI  SCREENING STUDIES:  Last mammogram: 2022  Last colonoscopy: 2017  MEDICATIONS: Outpatient Encounter Medications as of 06/12/2021  Medication Sig   b complex vitamins tablet Take 1 tablet by mouth daily.   BIOTIN FORTE PO Take by mouth daily.   cholecalciferol (VITAMIN D) 1000 units tablet Take 1,000 Units by mouth daily.   ferrous sulfate 325 (65 FE) MG tablet Take 325 mg by mouth daily with breakfast.   Omega-3 Fatty Acids (FISH OIL) 1200 MG CAPS Take 1,200 mg by mouth daily.   vitamin C (ASCORBIC ACID) 500 MG tablet Take 500 mg by mouth daily.   vitamin E 1000 UNIT capsule Take 1,000 Units by mouth daily.   benzonatate (TESSALON) 100 MG capsule Take 1 capsule (100 mg total) by mouth 3 (three) times daily as needed for cough. (Patient not taking: Reported on 06/10/2021)   No facility-administered encounter medications on file as of 06/12/2021.    ALLERGIES:  No Known Allergies   FAMILY HISTORY:  Family History  Problem Relation Age of Onset   Cancer Mother    Esophageal cancer Mother    Esophageal cancer Father    Mental illness Sister    Colon cancer Neg Hx    Colon polyps Neg Hx    Breast cancer Neg Hx    Ovarian cancer Neg Hx    Endometrial cancer Neg Hx    Pancreatic cancer Neg Hx    Prostate cancer Neg Hx      SOCIAL HISTORY:  Social Connections: Not on file    REVIEW OF SYSTEMS:  Denies appetite changes, fevers, chills, fatigue, unexplained weight changes. Denies hearing loss, neck lumps or masses, mouth sores, ringing in ears or voice changes. Denies cough or wheezing.  Denies shortness of breath. Denies chest pain or  palpitations. Denies leg swelling. Denies abdominal distention, pain, blood in stools, constipation, diarrhea, nausea, vomiting, or early satiety. Denies pain with intercourse, dysuria, frequency, hematuria or incontinence. Denies hot flashes, pelvic pain, vaginal bleeding or vaginal discharge.   Denies joint pain, back pain or muscle pain/cramps. Denies itching, rash, or wounds. Denies dizziness, headaches, numbness or seizures. Denies swollen lymph nodes or glands, denies easy bruising or bleeding. Denies anxiety, depression, confusion, or decreased concentration.  Physical Exam:  Vital Signs for this encounter:  Blood pressure (!) 159/98, pulse (!) 101, temperature 97.7 F (36.5 C), temperature source Tympanic, resp. rate 18, height 4\' 10"  (1.473 m), weight 174 lb (78.9 kg), SpO2 99 %. Body mass index is 36.37 kg/m. General: Alert, oriented, no acute distress.  HEENT: Normocephalic, atraumatic. Sclera anicteric.  Chest: Clear to auscultation bilaterally. No wheezes, rhonchi, or rales. Cardiovascular: Regular rate and rhythm, no murmurs, rubs, or gallops.  Abdomen: Obese. Normoactive bowel sounds. Soft, nondistended, nontender to palpation.  No masses or hepatosplenomegaly appreciated. No palpable fluid wave.  Extremities: Grossly normal range of motion. Warm, well perfused. No edema bilaterally.  Well-healed midline laparotomy incision. Skin: No rashes or lesions.  Lymphatics: No cervical, supraclavicular, or inguinal adenopathy.  GU:  Normal external female genitalia. No lesions. No discharge or bleeding.             Bladder/urethra:  No lesions or masses, well supported bladder             Vagina: Mildly atrophic, no lesions or masses.  See findings on vaginoscopy below.  On bimanual exam, cuff is smooth without nodularity or masses appreciated.  No vaginal masses.             Cervix/uterus: Surgically absent.             Adnexa: No masses appreciated.  Rectal:  Deferred  Vaginoscopy and vaginal biopsy procedure: Preoperative diagnosis: High-grade dysplasia noted on recent vaginal Pap Postoperative diagnosis: Same as above, suspect vaginal dysplasia Physician: Berline Lopes MD Procedure: Vaginal biopsy Estimated blood loss: Minimal Specimen: Vaginal cuff biopsy just left of midline Procedure: After the procedure was discussed with the patient including risks and alternatives, the patient gave verbal consent.  She was then placed in dorsolithotomy position.  A speculum was placed in the vagina and the vaginal cuff and upper vagina was well visualized.  5% acetic acid was then applied and vaginoscopy performed.  An approximately 4 mm area of acetowhite that appeared mildly thickened and raised was noted along the left aspect of the vaginal cuff, just lateral to healing biopsy site.  Biopsy was performed of this area.  Silver nitrate was used to achieve hemostasis.  No other areas of acetowhite or atypical vascularity were noted.  All instruments were removed from the vagina.  Overall the patient tolerated the procedure well.  LABORATORY AND RADIOLOGIC DATA:  Outside medical records were reviewed to synthesize the above history, along with the history and physical obtained during the visit.   Lab Results  Component Value Date   WBC 5.1 01/06/2018   HGB 12.6 01/06/2018   HCT 38.4 01/06/2018   PLT 332 01/06/2018   GLUCOSE 90 01/06/2018   CHOL 237 (H) 01/06/2018   TRIG 73 01/06/2018   HDL 50 (L) 01/06/2018   LDLCALC 170 (H) 01/06/2018   ALT 10 01/06/2018   AST 14 01/06/2018   NA 141 01/06/2018   K 4.2 01/06/2018   CL 106 01/06/2018   CREATININE 0.74 01/06/2018   BUN 15 01/06/2018   CO2 26 01/06/2018   TSH 1.94 01/06/2018

## 2021-06-12 NOTE — H&P (View-Only) (Signed)
GYNECOLOGIC ONCOLOGY NEW PATIENT CONSULTATION   Patient Name: Victoria Bush  Patient Age: 61 y.o. Date of Service: 06/12/2021 Referring Provider: Dr. Everlene Farrier  Primary Care Provider: Renold Genta Cresenciano Lick, MD Consulting Provider: Jeral Pinch, MD   Assessment/Plan:  Postmenopausal patient with remote history of cervical dysplasia and recent discrepancy between high-grade vaginal Pap and benign vaginal biopsy.  Discussed recent pap test results as well as biopsy. There does not appear to have been HPV testing on her recent pap test. I have asked my office to call pathology to see if this can be added.  On exam today, there are findings on vaginoscopy that are concerning for low grade dysplasia. Biopsy performed today from this area. I will call the patient when results are back next week.  Discussed very likely history of HPV-related cervical dysplasia.  If biopsy today shows high-grade dysplasia, we reviewed treatment options including laser ablation and partial vaginectomy. If biopsy is benign or shows low-grade dysplasia, then we will await HPV testing and plan to proceed with close surveillance.    A copy of this note was sent to the patient's referring provider.   65 minutes of total time was spent for this patient encounter, including preparation, face-to-face counseling with the patient and coordination of care, and documentation of the encounter.   Jeral Pinch, MD  Division of Gynecologic Oncology  Department of Obstetrics and Gynecology  [facility]  ___________________________________________  Chief Complaint: Chief Complaint  Patient presents with   VAIN (vaginal intraepithelial neoplasia)    History of Present Illness:  Victoria Bush is a 61 y.o. y.o. female who is seen in consultation at the request of Dr. Gaetano Net for an evaluation of discrepant Pap and vaginal biopsy, high-grade intraepithelial lesion by Pap testing, remote  history of cervical dysplasia.  Patient was seen for her annual visit on 11/21.  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  Patient was then seen for colposcopy on 12/2 with findings of some acetowhite changes and biopsy performed.  Biopsy with a diagnosis of benign squamous mucosa, no atypia, dysplasia, or malignancy.  Patient endorses a remote history of abnormal Pap smears in her teens.  She notes at one point having cryotherapy.  Subsequent Pap smears for many years were normal.  She underwent total abdominal hysterectomy with BSO in 2014 for uterine fibroids.  Findings at the time of surgery were also pertinent for endometriosis, confirmed on final pathology.  Patient denies any vaginal bleeding or discharge since her hysterectomy.  She denies any pelvic pain.  She reports regular bowel and bladder function.  She endorses a good appetite although does not eat much at baseline.  She denies any weight changes recently but is trying to lose weight.  She denies any nausea or emesis.  Patient lives in [place] with her husband and sister.  She works part-time doing Physiological scientist.  PAST MEDICAL HISTORY:  Past Medical History:  Diagnosis Date   BMI 34.0-34.9,adult    Migraine    no aura; lifetime history     PAST SURGICAL HISTORY:  Past Surgical History:  Procedure Laterality Date   ABDOMINAL HYSTERECTOMY  06/19/2012   Procedure: HYSTERECTOMY ABDOMINAL;  Surgeon: Allena Katz, MD;  Location: [facility];  Service: Gynecology;  Laterality: N/A;   breast cystectomy bilateral     MYOMECTOMY ABDOMINAL APPROACH     x2, laparotomy   SALPINGOOPHORECTOMY  06/19/2012   Procedure: SALPINGO OOPHORECTOMY;  Surgeon: Allena Katz, MD;  Location: [facility];  Service: Gynecology;  Laterality: Bilateral;   WISDOM TOOTH EXTRACTION      OB/GYN HISTORY:  OB History  Gravida Para Term Preterm AB Living  0 0 0 0 0  0  SAB IAB Ectopic Multiple Live Births  0 0 0 0 0    No LMP recorded. Patient has had a hysterectomy.  Age at menarche: 43 Age at menopause: 76 Hx of HRT: Denies Hx of STDs: Denies, although I suspect has a remote history of HPV Last pap: See HPI.  Prior to Pap smears were in 2019 and 2016 History of abnormal pap smears: Yes, see HPI  SCREENING STUDIES:  Last mammogram: 2022  Last colonoscopy: 2017  MEDICATIONS: Outpatient Encounter Medications as of 06/12/2021  Medication Sig   b complex vitamins tablet Take 1 tablet by mouth daily.   BIOTIN FORTE PO Take by mouth daily.   cholecalciferol (VITAMIN D) 1000 units tablet Take 1,000 Units by mouth daily.   ferrous sulfate 325 (65 FE) MG tablet Take 325 mg by mouth daily with breakfast.   Omega-3 Fatty Acids (FISH OIL) 1200 MG CAPS Take 1,200 mg by mouth daily.   vitamin C (ASCORBIC ACID) 500 MG tablet Take 500 mg by mouth daily.   vitamin E 1000 UNIT capsule Take 1,000 Units by mouth daily.   benzonatate (TESSALON) 100 MG capsule Take 1 capsule (100 mg total) by mouth 3 (three) times daily as needed for cough. (Patient not taking: Reported on 06/10/2021)   No facility-administered encounter medications on file as of 06/12/2021.    ALLERGIES:  No Known Allergies   FAMILY HISTORY:  Family History  Problem Relation Age of Onset   Cancer Mother    Esophageal cancer Mother    Esophageal cancer Father    Mental illness Sister    Colon cancer Neg Hx    Colon polyps Neg Hx    Breast cancer Neg Hx    Ovarian cancer Neg Hx    Endometrial cancer Neg Hx    Pancreatic cancer Neg Hx    Prostate cancer Neg Hx      SOCIAL HISTORY:  Social Connections: Not on file    REVIEW OF SYSTEMS:  Denies appetite changes, fevers, chills, fatigue, unexplained weight changes. Denies hearing loss, neck lumps or masses, mouth sores, ringing in ears or voice changes. Denies cough or wheezing.  Denies shortness of breath. Denies chest pain or  palpitations. Denies leg swelling. Denies abdominal distention, pain, blood in stools, constipation, diarrhea, nausea, vomiting, or early satiety. Denies pain with intercourse, dysuria, frequency, hematuria or incontinence. Denies hot flashes, pelvic pain, vaginal bleeding or vaginal discharge.   Denies joint pain, back pain or muscle pain/cramps. Denies itching, rash, or wounds. Denies dizziness, headaches, numbness or seizures. Denies swollen lymph nodes or glands, denies easy bruising or bleeding. Denies anxiety, depression, confusion, or decreased concentration.  Physical Exam:  Vital Signs for this encounter:  Blood pressure (!) 159/98, pulse (!) 101, temperature 97.7 F (36.5 C), temperature source Tympanic, resp. rate 18, height 4\' 10"  (1.473 m), weight 174 lb (78.9 kg), SpO2 99 %. Body mass index is 36.37 kg/m. General: Alert, oriented, no acute distress.  HEENT: Normocephalic, atraumatic. Sclera anicteric.  Chest: Clear to auscultation bilaterally. No wheezes, rhonchi, or rales. Cardiovascular: Regular rate and rhythm, no murmurs, rubs, or gallops.  Abdomen: Obese. Normoactive bowel sounds. Soft, nondistended, nontender to palpation.  No masses or hepatosplenomegaly appreciated. No palpable fluid wave.  Extremities: Grossly normal range of motion. Warm, well perfused. No edema bilaterally.  Well-healed midline laparotomy incision. Skin: No rashes or lesions.  Lymphatics: No cervical, supraclavicular, or inguinal adenopathy.  GU:  Normal external female genitalia. No lesions. No discharge or bleeding.             Bladder/urethra:  No lesions or masses, well supported bladder             Vagina: Mildly atrophic, no lesions or masses.  See findings on vaginoscopy below.  On bimanual exam, cuff is smooth without nodularity or masses appreciated.  No vaginal masses.             Cervix/uterus: Surgically absent.             Adnexa: No masses appreciated.  Rectal:  Deferred  Vaginoscopy and vaginal biopsy procedure: Preoperative diagnosis: High-grade dysplasia noted on recent vaginal Pap Postoperative diagnosis: Same as above, suspect vaginal dysplasia Physician: Berline Lopes MD Procedure: Vaginal biopsy Estimated blood loss: Minimal Specimen: Vaginal cuff biopsy just left of midline Procedure: After the procedure was discussed with the patient including risks and alternatives, the patient gave verbal consent.  She was then placed in dorsolithotomy position.  A speculum was placed in the vagina and the vaginal cuff and upper vagina was well visualized.  5% acetic acid was then applied and vaginoscopy performed.  An approximately 4 mm area of acetowhite that appeared mildly thickened and raised was noted along the left aspect of the vaginal cuff, just lateral to healing biopsy site.  Biopsy was performed of this area.  Silver nitrate was used to achieve hemostasis.  No other areas of acetowhite or atypical vascularity were noted.  All instruments were removed from the vagina.  Overall the patient tolerated the procedure well.  LABORATORY AND RADIOLOGIC DATA:  Outside medical records were reviewed to synthesize the above history, along with the history and physical obtained during the visit.   Lab Results  Component Value Date   WBC 5.1 01/06/2018   HGB 12.6 01/06/2018   HCT 38.4 01/06/2018   PLT 332 01/06/2018   GLUCOSE 90 01/06/2018   CHOL 237 (H) 01/06/2018   TRIG 73 01/06/2018   HDL 50 (L) 01/06/2018   LDLCALC 170 (H) 01/06/2018   ALT 10 01/06/2018   AST 14 01/06/2018   NA 141 01/06/2018   K 4.2 01/06/2018   CL 106 01/06/2018   CREATININE 0.74 01/06/2018   BUN 15 01/06/2018   CO2 26 01/06/2018   TSH 1.94 01/06/2018

## 2021-06-16 LAB — SURGICAL PATHOLOGY

## 2021-06-17 ENCOUNTER — Telehealth: Payer: Self-pay | Admitting: Gynecologic Oncology

## 2021-06-17 NOTE — Telephone Encounter (Signed)
Called patient. Reviewed biopsy results. Given high-grade dysplasia, I recommend treatment. My preference, if feasible, would be vaginectomy. Discussed that if not feasible, then would proceed with laser ablation. Patient will be contacted later this week to set up date for surgery.  Jeral Pinch MD Gynecologic Oncology

## 2021-06-18 ENCOUNTER — Telehealth: Payer: Self-pay

## 2021-06-18 NOTE — Telephone Encounter (Signed)
Spoke with Victoria Bush this afternoon to discuss surgery scheduling. Offered patient surgical dates of 06/30/21 and 07/01/21. Patient would like to proceed with surgery on 06/30/21.   Patient inquiring how long surgery will be. Surgery will approximately be around 1 hour. Patient verbalized understanding. Instructed to call with any needs.

## 2021-06-19 ENCOUNTER — Telehealth: Payer: Self-pay

## 2021-06-19 NOTE — Telephone Encounter (Signed)
Spoke with Victoria Bush this morning, per [name], NP, pt is instructed to stop taking her fish oil capsules now. Patient verbalized understanding. Pt states she has access to MyChart and would like her surgery instructions sent via [place].

## 2021-06-22 ENCOUNTER — Other Ambulatory Visit: Payer: Self-pay | Admitting: Gynecologic Oncology

## 2021-06-22 DIAGNOSIS — N893 Dysplasia of vagina, unspecified: Secondary | ICD-10-CM

## 2021-06-22 MED ORDER — SENNOSIDES-DOCUSATE SODIUM 8.6-50 MG PO TABS: 2.0000 | ORAL_TABLET | Freq: Every day | ORAL | 0 refills | Status: DC

## 2021-06-22 MED ORDER — TRAMADOL HCL 50 MG PO TABS: 50.0000 mg | ORAL_TABLET | Freq: Four times a day (QID) | ORAL | 0 refills | Status: DC | PRN

## 2021-06-22 MED ORDER — IBUPROFEN 800 MG PO TABS: 800.0000 mg | ORAL_TABLET | Freq: Three times a day (TID) | ORAL | 0 refills | Status: DC | PRN

## 2021-06-22 NOTE — Progress Notes (Signed)
Preparing for your Surgery  Plan for surgery on June 30, 2021 with Dr. Jeral Pinch at [facility]. You will be scheduled for examination under anesthesia, partial vaginectomy, possible carbon dioxide laser application of the vagina.   Pre-operative Testing -You will receive a phone call from presurgical testing at [facility] to discuss surgery instructions and arrange for lab work if needed.  -Bring your insurance card, copy of an advanced directive if applicable, medication list.  -You should not be taking blood thinners or aspirin at least ten days prior to surgery unless instructed by your surgeon.  -Do not take supplements such as fish oil (omega 3), red yeast rice, turmeric before your surgery. You want to avoid medications with aspirin in them including headache powders such as BC or Goody's), Excedrin migraine.  Day Before Surgery at [place] will be advised you can have clear liquids up until 3 hours before your surgery.    Your role in recovery Your role is to become active as soon as directed by your doctor, while still giving yourself time to heal.  Rest when you feel tired. You will be asked to do the following in order to speed your recovery:  - Cough and breathe deeply. This helps to clear and expand your lungs and can prevent pneumonia after surgery.  - [place]. Do mild physical activity. Walking or moving your legs help your circulation and body functions return to normal. Do not try to get up or walk alone the first time after surgery.   -If you develop swelling on one leg or the other, pain in the back of your leg, redness/warmth in one of your legs, please call the office or go to the Emergency Room to have a doppler to rule out a blood clot. For shortness of breath, chest pain-seek care in the Emergency Room as soon as possible. - Actively manage your pain. Managing your pain lets you move in comfort.  We will ask you to rate your pain on a scale of zero to 10. It is your responsibility to tell your doctor or nurse where and how much you hurt so your pain can be treated.  Special Considerations -Your final pathology results from surgery should be available around one week after surgery and the results will be relayed to you when available.  -FMLA forms can be faxed to [phone] and please allow 5-7 business days for completion.  Pain Management After Surgery -You have been prescribed your pain medication and bowel regimen medications before surgery so that you can have these available when you are discharged from the hospital. The pain medication is for use ONLY AFTER surgery and a new prescription will not be given.   -Make sure that you have Tylenol and Ibuprofen at home to use on a regular basis after surgery for pain control. We recommend alternating the medications every hour to six hours since they work differently and are processed in the body differently for pain relief.  -Review the attached handout on narcotic use and their risks and side effects.   Bowel Regimen -You have been prescribed Sennakot-S to take nightly to prevent constipation especially if you are taking the narcotic pain medication intermittently.  It is important to prevent constipation and drink adequate amounts of liquids. You can stop taking this medication when you are not taking pain medication and you are back on your normal bowel routine.  Risks of Surgery Risks  of surgery are low but include bleeding, infection, damage to surrounding structures, re-operation, blood clots, and very rarely death.  AFTER SURGERY INSTRUCTIONS  Return to work:  1-2 weeks if applicable  Activity: 1. Be up and out of the bed during the day.  Take a nap if needed.  You may walk up steps but be careful and use the hand rail.  Stair climbing will tire you more than you think, you may need to stop part way and rest.   2. No  lifting or straining for 2 weeks over 10 pounds, minimum. No pushing, pulling, straining for 2 weeks.  3. No driving for minimum 24 hours after surgery.  Do not drive if you are taking narcotic pain medicine and make sure that your reaction time has returned.   4. You can shower as soon as the next day after surgery. Shower daily. No tub baths or submerging your body in water until cleared by your surgeon. If you have the soap that was given to you by pre-surgical testing that was used before surgery, you do not need to use it afterwards because this can irritate your incisions.   5. No sexual activity and nothing in the vagina for 4-6 weeks. You will be seen in the office for a post-op check to assess healing.  6. You may experience vaginal spotting and discharge after surgery.  The spotting is normal but if you experience heavy bleeding, call our office.  7. Take Tylenol or ibuprofen first for pain and only use narcotic pain medication for severe pain not relieved by the Tylenol or Ibuprofen.  Monitor your Tylenol intake to a max of 4,000 mg in a 24 hour period. You can alternate these medications after surgery.  Diet: 1. Low sodium Heart Healthy Diet is recommended but you are cleared to resume your normal (before surgery) diet after your procedure.  2. It is safe to use a laxative, such as Miralax or Colace, if you have difficulty moving your bowels. You have been prescribed Sennakot at bedtime every evening to keep bowel movements regular and to prevent constipation.    Wound Care: 1. Keep clean and dry.  Shower daily.  Reasons to call the Doctor: Fever - Oral temperature greater than 100.4 degrees Fahrenheit Foul-smelling vaginal discharge Difficulty urinating Nausea and vomiting Increased pain at the site of the incision that is unrelieved with pain medicine. Difficulty breathing with or without chest pain New calf pain especially if only on one side Sudden, continuing increased  vaginal bleeding with or without clots.   Contacts: For questions or concerns you should contact:  Dr. Jeral Pinch at [phone]  [name], NP at [phone]  After Hours: call [phone] and have the GYN Oncologist paged/contacted (after 5 pm or on the weekends).  Messages sent via mychart are for non-urgent matters and are not responded to after hours so for urgent needs, please call the after hours number.

## 2021-06-25 ENCOUNTER — Other Ambulatory Visit: Payer: Self-pay

## 2021-06-25 ENCOUNTER — Encounter (HOSPITAL_BASED_OUTPATIENT_CLINIC_OR_DEPARTMENT_OTHER): Payer: Self-pay | Admitting: Gynecologic Oncology

## 2021-06-25 NOTE — Progress Notes (Signed)
Spoke w/ via phone for pre-op interview---pt Lab needs dos----     none          Lab results------none COVID test -----patient states asymptomatic no test needed Arrive at -------1100 am 06-30-2021 NPO after MN NO Solid Food.  Clear liquids from MN until---1000 am Med rec completed Medications to take morning of surgery -----none Diabetic medication -----n/a Patient instructed no nail polish to be worn day of surgery Patient instructed to bring photo id and insurance card day of surgery Patient aware to have Driver (ride ) / caregiver    for 24 hours after surgery husband Victoria Bush  Patient Special Instructions -----none Pre-Op special Istructions -----none Patient verbalized understanding of instructions that were given at this phone interview. Patient denies shortness of breath, chest pain, fever, cough at this phone interview.

## 2021-06-29 ENCOUNTER — Telehealth: Payer: Self-pay

## 2021-06-29 NOTE — Telephone Encounter (Signed)
Attempted to reach patient to check in with her pre-operatively. Unable to reach patient. Left message requesting return call.  

## 2021-06-29 NOTE — Telephone Encounter (Signed)
Received call from Ms. Haberle , patient compliant with pre-operative instructions.  Reinforced nothing to eat after midnight. Clear liquids until 10:00 am. Patient to arrive at 11:00 am.  No questions or concerns voiced.  Instructed to call for any needs.

## 2021-06-30 ENCOUNTER — Other Ambulatory Visit: Payer: Self-pay | Admitting: Gynecologic Oncology

## 2021-06-30 ENCOUNTER — Encounter (HOSPITAL_BASED_OUTPATIENT_CLINIC_OR_DEPARTMENT_OTHER): Payer: Self-pay | Admitting: Gynecologic Oncology

## 2021-06-30 ENCOUNTER — Other Ambulatory Visit: Payer: Self-pay

## 2021-06-30 ENCOUNTER — Ambulatory Visit (HOSPITAL_BASED_OUTPATIENT_CLINIC_OR_DEPARTMENT_OTHER): Admitting: Anesthesiology

## 2021-06-30 ENCOUNTER — Encounter (HOSPITAL_BASED_OUTPATIENT_CLINIC_OR_DEPARTMENT_OTHER)
Admission: RE | Disposition: A | Discharge status: Home / Self Care | Payer: Self-pay | Source: Home / Self Care | Attending: Gynecologic Oncology

## 2021-06-30 ENCOUNTER — Ambulatory Visit (HOSPITAL_BASED_OUTPATIENT_CLINIC_OR_DEPARTMENT_OTHER)
Admission: RE | Admit: 2021-06-30 | Discharge: 2021-06-30 | Disposition: A | Discharge status: Home / Self Care | Attending: Gynecologic Oncology | Admitting: Gynecologic Oncology

## 2021-06-30 DIAGNOSIS — N891 Moderate vaginal dysplasia: Secondary | ICD-10-CM | POA: Insufficient documentation

## 2021-06-30 DIAGNOSIS — D072 Carcinoma in situ of vagina: Secondary | ICD-10-CM | POA: Diagnosis not present

## 2021-06-30 DIAGNOSIS — Z86018 Personal history of other benign neoplasm: Secondary | ICD-10-CM | POA: Insufficient documentation

## 2021-06-30 DIAGNOSIS — N809 Endometriosis, unspecified: Secondary | ICD-10-CM | POA: Insufficient documentation

## 2021-06-30 DIAGNOSIS — Z90710 Acquired absence of both cervix and uterus: Secondary | ICD-10-CM | POA: Insufficient documentation

## 2021-06-30 DIAGNOSIS — L0291 Cutaneous abscess, unspecified: Secondary | ICD-10-CM

## 2021-06-30 DIAGNOSIS — N893 Dysplasia of vagina, unspecified: Secondary | ICD-10-CM

## 2021-06-30 DIAGNOSIS — Z90722 Acquired absence of ovaries, bilateral: Secondary | ICD-10-CM | POA: Insufficient documentation

## 2021-06-30 HISTORY — PX: VAGINECTOMY, PARTIAL: SHX6846

## 2021-06-30 HISTORY — PX: CO2 LASER APPLICATION: SHX5778

## 2021-06-30 HISTORY — DX: Dysplasia of vagina, unspecified: N89.3

## 2021-06-30 HISTORY — DX: Presence of spectacles and contact lenses: Z97.3

## 2021-06-30 HISTORY — DX: Anemia, unspecified: D64.9

## 2021-06-30 SURGERY — EXAM UNDER ANESTHESIA
Anesthesia: General | Site: Vagina

## 2021-06-30 MED ORDER — LIDOCAINE HCL (PF) 2 % IJ SOLN
INTRAMUSCULAR | Status: AC
  Filled 2021-06-30: qty 5

## 2021-06-30 MED ORDER — PROPOFOL 10 MG/ML IV BOLUS
INTRAVENOUS | Status: DC | PRN
  Administered 2021-06-30: 200 mg via INTRAVENOUS
  Administered 2021-06-30: 100 mg via INTRAVENOUS

## 2021-06-30 MED ORDER — DEXAMETHASONE SODIUM PHOSPHATE 4 MG/ML IJ SOLN
INTRAMUSCULAR | Status: DC | PRN
  Administered 2021-06-30: 10 mg via INTRAVENOUS

## 2021-06-30 MED ORDER — LACTATED RINGERS IV SOLN: INTRAVENOUS | Status: DC

## 2021-06-30 MED ORDER — ACETAMINOPHEN 500 MG PO TABS
ORAL_TABLET | ORAL | Status: AC
  Filled 2021-06-30: qty 2

## 2021-06-30 MED ORDER — PROPOFOL 10 MG/ML IV BOLUS
INTRAVENOUS | Status: AC
  Filled 2021-06-30: qty 20

## 2021-06-30 MED ORDER — SCOPOLAMINE 1 MG/3DAYS TD PT72
MEDICATED_PATCH | TRANSDERMAL | Status: AC
  Filled 2021-06-30: qty 1

## 2021-06-30 MED ORDER — CELECOXIB 200 MG PO CAPS
200.0000 mg | ORAL_CAPSULE | ORAL | Status: AC
  Administered 2021-06-30: 11:00:00 200 mg via ORAL

## 2021-06-30 MED ORDER — LIDOCAINE 2% (20 MG/ML) 5 ML SYRINGE
INTRAMUSCULAR | Status: DC | PRN
  Administered 2021-06-30: 60 mg via INTRAVENOUS

## 2021-06-30 MED ORDER — ACETAMINOPHEN 500 MG PO TABS
1000.0000 mg | ORAL_TABLET | ORAL | Status: AC
  Administered 2021-06-30: 11:00:00 1000 mg via ORAL

## 2021-06-30 MED ORDER — DEXAMETHASONE SODIUM PHOSPHATE 10 MG/ML IJ SOLN: 4.0000 mg | INTRAMUSCULAR | Status: DC

## 2021-06-30 MED ORDER — MIDAZOLAM HCL 2 MG/2ML IJ SOLN
INTRAMUSCULAR | Status: AC
  Filled 2021-06-30: qty 2

## 2021-06-30 MED ORDER — ONDANSETRON HCL 4 MG/2ML IJ SOLN
INTRAMUSCULAR | Status: AC
  Filled 2021-06-30: qty 2

## 2021-06-30 MED ORDER — SCOPOLAMINE 1 MG/3DAYS TD PT72
1.0000 | MEDICATED_PATCH | TRANSDERMAL | Status: DC
  Administered 2021-06-30: 11:00:00 1.5 mg via TRANSDERMAL

## 2021-06-30 MED ORDER — ONDANSETRON HCL 4 MG/2ML IJ SOLN
INTRAMUSCULAR | Status: DC | PRN
Start: 2021-06-30 — End: 2021-06-30
  Administered 2021-06-30: 4 mg via INTRAVENOUS

## 2021-06-30 MED ORDER — DEXAMETHASONE SODIUM PHOSPHATE 10 MG/ML IJ SOLN
INTRAMUSCULAR | Status: AC
  Filled 2021-06-30: qty 1

## 2021-06-30 MED ORDER — FENTANYL CITRATE (PF) 100 MCG/2ML IJ SOLN: 25.0000 ug | INTRAMUSCULAR | Status: DC | PRN

## 2021-06-30 MED ORDER — FENTANYL CITRATE (PF) 100 MCG/2ML IJ SOLN
INTRAMUSCULAR | Status: DC | PRN
  Administered 2021-06-30 (×2): 25 ug via INTRAVENOUS
  Administered 2021-06-30: 50 ug via INTRAVENOUS

## 2021-06-30 MED ORDER — FENTANYL CITRATE (PF) 100 MCG/2ML IJ SOLN
INTRAMUSCULAR | Status: AC
  Filled 2021-06-30: qty 2

## 2021-06-30 MED ORDER — IODINE STRONG (LUGOLS) 5 % PO SOLN
ORAL | Status: DC | PRN
  Administered 2021-06-30: 0.1 mL via ORAL

## 2021-06-30 MED ORDER — BUPIVACAINE HCL 0.25 % IJ SOLN
INTRAMUSCULAR | Status: DC | PRN
  Administered 2021-06-30: 10 mL

## 2021-06-30 MED ORDER — 0.9 % SODIUM CHLORIDE (POUR BTL) OPTIME
TOPICAL | Status: DC | PRN
  Administered 2021-06-30: 14:00:00 500 mL

## 2021-06-30 MED ORDER — MIDAZOLAM HCL 5 MG/5ML IJ SOLN
INTRAMUSCULAR | Status: DC | PRN
  Administered 2021-06-30 (×2): 1 mg via INTRAVENOUS
  Administered 2021-06-30: 2 mg via INTRAVENOUS

## 2021-06-30 MED ORDER — CELECOXIB 200 MG PO CAPS
ORAL_CAPSULE | ORAL | Status: AC
  Filled 2021-06-30: qty 1

## 2021-06-30 SURGICAL SUPPLY — 44 items
BLADE CLIPPER SENSICLIP SURGIC (BLADE) IMPLANT
BLADE EXTENDED COATED 6.5IN (ELECTRODE) ×2 IMPLANT
BLADE SURG 11 STRL SS (BLADE) IMPLANT
BLADE SURG 15 STRL LF DISP TIS (BLADE) ×2 IMPLANT
BLADE SURG 15 STRL SS (BLADE) ×3
CANISTER SUCT 1200ML W/VALVE (MISCELLANEOUS) IMPLANT
CATH ROBINSON RED A/P 16FR (CATHETERS) ×4 IMPLANT
DEPRESSOR TONGUE BLADE STERILE (MISCELLANEOUS) ×4 IMPLANT
DRSG TELFA 3X8 NADH (GAUZE/BANDAGES/DRESSINGS) ×3 IMPLANT
ELECT BALL LEEP 3MM BLK (ELECTRODE) IMPLANT
GAUZE 4X4 16PLY ~~LOC~~+RFID DBL (SPONGE) ×8 IMPLANT
GLOVE SURG ENC MOIS LTX SZ6 (GLOVE) ×8 IMPLANT
GOWN STRL REUS W/TWL LRG LVL3 (GOWN DISPOSABLE) ×4 IMPLANT
KIT TURNOVER CYSTO (KITS) ×4 IMPLANT
NDL HYPO 25X1 1.5 SAFETY (NEEDLE) ×2 IMPLANT
NEEDLE HYPO 25X1 1.5 SAFETY (NEEDLE) ×3 IMPLANT
NS IRRIG 500ML POUR BTL (IV SOLUTION) ×4 IMPLANT
PACK PERINEAL COLD (PAD) ×4 IMPLANT
PACK VAGINAL WOMENS (CUSTOM PROCEDURE TRAY) ×4 IMPLANT
PAD DRESSING TELFA 3X8 NADH (GAUZE/BANDAGES/DRESSINGS) IMPLANT
PAD PREP 24X48 CUFFED NSTRL (MISCELLANEOUS) ×4 IMPLANT
PENCIL BUTTON HOLSTER BLD 10FT (ELECTRODE) ×4 IMPLANT
PUNCH BIOPSY DERMAL 3 (INSTRUMENTS) IMPLANT
PUNCH BIOPSY DERMAL 3MM (INSTRUMENTS)
PUNCH BIOPSY DERMAL 4MM (INSTRUMENTS) IMPLANT
SUT VIC AB 0 CT1 36 (SUTURE) IMPLANT
SUT VIC AB 0 SH 27 (SUTURE) ×4 IMPLANT
SUT VIC AB 2-0 CT2 27 (SUTURE) IMPLANT
SUT VIC AB 2-0 SH 27 (SUTURE)
SUT VIC AB 2-0 SH 27XBRD (SUTURE) IMPLANT
SUT VIC AB 3-0 PS2 18 (SUTURE)
SUT VIC AB 3-0 PS2 18XBRD (SUTURE) IMPLANT
SUT VIC AB 3-0 SH 27 (SUTURE) ×3
SUT VIC AB 3-0 SH 27X BRD (SUTURE) ×2 IMPLANT
SUT VIC AB 4-0 PS2 18 (SUTURE) ×4 IMPLANT
SUT VICRYL 0 UR6 27IN ABS (SUTURE) IMPLANT
SWAB COLLECTION DEVICE MRSA (MISCELLANEOUS) ×2 IMPLANT
SWAB OB GYN 8IN STERILE 2PK (MISCELLANEOUS) ×8 IMPLANT
SYR BULB IRRIG 60ML STRL (SYRINGE) ×4 IMPLANT
TOWEL OR 17X26 10 PK STRL BLUE (TOWEL DISPOSABLE) ×8 IMPLANT
TUBE CONNECTING 12'X1/4 (SUCTIONS) ×1
TUBE CONNECTING 12X1/4 (SUCTIONS) ×1 IMPLANT
VACUUM HOSE 7/8X10 W/ WAND (MISCELLANEOUS) IMPLANT
WATER STERILE IRR 500ML POUR (IV SOLUTION) ×4 IMPLANT

## 2021-06-30 NOTE — Op Note (Addendum)
PATIENT: Victoria Bush  ENCOUNTER DATE: 06/30/21  Preop Diagnosis: VAIN3  Postoperative Diagnosis: same as above  Surgery: EUA, cultures, vaginal cuff biopsies  Surgeons:  Valarie Cones MD  Assistant: [name] NP  Anesthesia: General   Estimated blood loss: 37ml  IVF:  1000 ml   Urine output: n/a   Complications: None apparent  Pathology: vaginal cuff biopsies, cultures  Operative findings: Thickness noted along length superior to the vaginal cuff. NO nodularity. Healing biopsy sites noted along vaginal cuff just left of midline. Mildly decreased uptake along entire vaginal cuff. Upon injection of local anesthesia around planned area of laser, there was purulent fluid noted to be draining from injection site. Culture collected. Given this finding, decision made not to proceed with excision or laser. Instead, multiple biopsies of the vaginal cuff around prior biopsy sites taken.   Procedure: The patient was identified in the preoperative holding area. Informed consent was signed on the chart. Patient was seen history was reviewed and exam was performed.   The patient was then taken to the operating room and placed in the supine position with SCD hose on. General anesthesia was then induced without difficulty. She was then placed in the dorsolithotomy position. The perineum was prepped with Betadine. The vagina was prepped with Betadine. The patient was then draped after the prep was dried.   Timeout was performed the patient, procedure, antibiotic, allergy, and length of procedure. A speculum was placed in the vagina and the cuff was well visualized. Lugols was applied with findings as above.   After injection of local anesthesia, cultures collected. Speculum exam was removed and rectovaginal exam re-performed. Gloves were changed and speculum was placed back in the vagina. Four biopsies were taken circumferentially around the prior vaginal biopsies.   Monopolar electrocautery  was used circumferentially to achieve hemostasis.   The vagina was irrigated.  All instrument, suture, laparotomy, Ray-Tec, and needle counts were correct x2. The patient tolerated the procedure well and was taken recovery room in stable condition.   Valarie Cones MD

## 2021-06-30 NOTE — Anesthesia Preprocedure Evaluation (Addendum)
Anesthesia Evaluation  Patient identified by MRN, date of birth, ID band Patient awake    Reviewed: Allergy & Precautions, NPO status , Patient's Chart, lab work & pertinent test results  Airway Mallampati: I  TM Distance: >3 FB Neck ROM: Full    Dental no notable dental hx. (+) Teeth Intact, Dental Advisory Given   Pulmonary neg pulmonary ROS,    Pulmonary exam normal breath sounds clear to auscultation       Cardiovascular negative cardio ROS Normal cardiovascular exam Rhythm:Regular Rate:Normal     Neuro/Psych  Headaches, negative psych ROS   GI/Hepatic negative GI ROS, Neg liver ROS,   Endo/Other  negative endocrine ROS  Renal/GU negative Renal ROS  negative genitourinary   Musculoskeletal negative musculoskeletal ROS (+)   Abdominal   Peds  Hematology negative hematology ROS (+)   Anesthesia Other Findings   Reproductive/Obstetrics                            Anesthesia Physical Anesthesia Plan  ASA: 2  Anesthesia Plan: General   Post-op Pain Management: Tylenol PO (pre-op)   Induction: Intravenous  PONV Risk Score and Plan: 3 and Ondansetron, Dexamethasone and Midazolam  Airway Management Planned: LMA  Additional Equipment:   Intra-op Plan:   Post-operative Plan: Extubation in OR  Informed Consent: I have reviewed the patients History and Physical, chart, labs and discussed the procedure including the risks, benefits and alternatives for the proposed anesthesia with the patient or authorized representative who has indicated his/her understanding and acceptance.     Dental advisory given  Plan Discussed with: CRNA  Anesthesia Plan Comments:         Anesthesia Quick Evaluation

## 2021-06-30 NOTE — Transfer of Care (Signed)
Immediate Anesthesia Transfer of Care Note  Patient: Victoria Bush  Procedure(s) Performed: Procedure(s) (LRB): EXAM UNDER ANESTHESIA (N/A) VAGINECTOMY,PARTIALaborted vaginal biopsy (N/A) CO2 LASER APPLICATION (N/A)  Patient Location: PACU  Anesthesia Type: General  Level of Consciousness: awake, sedated, patient cooperative and responds to stimulation  Airway & Oxygen Therapy: Patient Spontanous Breathing and Patient connected to [place] 02 and soft FM   Post-op Assessment: Report given to PACU RN, Post -op Vital signs reviewed and stable and Patient moving all extremities  Post vital signs: Reviewed and stable  Complications: No apparent anesthesia complications

## 2021-06-30 NOTE — Interval H&P Note (Signed)
History and Physical Interval Note:  06/30/2021 10:09 AM  Victoria Bush  has presented today for surgery, with the diagnosis of [place].  The various methods of treatment have been discussed with the patient and family. After consideration of risks, benefits and other options for treatment, the patient has consented to  Procedure(s): EXAM UNDER ANESTHESIA (N/A) VAGINECTOMY,PARTIAL (N/A) CO2 LASER APPLICATION (N/A) as a surgical intervention.  The patient's history has been reviewed, patient examined, no change in status, stable for surgery.  I have reviewed the patient's chart and labs.  Questions were answered to the patient's satisfaction.     [name]

## 2021-06-30 NOTE — Anesthesia Procedure Notes (Signed)
Procedure Name: LMA Insertion Date/Time: 06/30/2021 1:29 PM Performed by: Justice Rocher, CRNA Pre-anesthesia Checklist: Patient identified, Emergency Drugs available, Suction available, Patient being monitored and Timeout performed Patient Re-evaluated:Patient Re-evaluated prior to induction Oxygen Delivery Method: Circle system utilized Preoxygenation: Pre-oxygenation with 100% oxygen Induction Type: IV induction Ventilation: Mask ventilation without difficulty LMA: LMA inserted LMA Size: 4.0 Number of attempts: 1 Airway Equipment and Method: Bite block Placement Confirmation: positive ETCO2, breath sounds checked- equal and bilateral and CO2 detector Tube secured with: Tape Dental Injury: Teeth and Oropharynx as per pre-operative assessment

## 2021-06-30 NOTE — Discharge Instructions (Addendum)
06/30/2021  Return to work: 1-2 days if applicable  On today's exam, we saw drainage that looked concerning for pus coming from the top of the vagina. We took cultures of this and we will call you with the results. You may need antibiotics. Dr. Berline Lopes also took additional biopsies. Given the findings at the time of surgery, Dr. Berline Lopes is recommending you have MRI imaging to further evaluate the top of the vagina and the pelvis. We will contact you with the information for this. There was no laser or surgical excision done during this procedure due to the unexpected findings of pus-like drainage, needing to be further investigated with additional imaging.   Activity: 1. Be up and out of the bed during the day.  Take a nap if needed.  You may walk up steps but be careful and use the hand rail.  Stair climbing will tire you more than you think, you may need to stop part way and rest.   2. No lifting or straining over 10 lbs, pushing, pulling, straining for 1 weeks.  3. No driving for minimum of 24 hours after surgery.  Do not drive if you are taking narcotic pain medicine. You need to make sure your reaction time has returned and you can brake safely.  4. Shower daily.  Use your regular soap to bathe.  No tub baths until cleared by your surgeon.   5. No sexual activity and nothing in the vagina for 4 weeks. You will need to have your check up in the office.  6. You may experience vaginal spotting and or drainage after surgery. The spotting is normal but if you experience heavy bleeding, call our office.  7. Take Tylenol or ibuprofen first for pain and only use narcotic pain medication for severe pain not relieved by the Tylenol or Ibuprofen.  Monitor your Tylenol intake to a max of 4,000 mg.   Diet: 1. Low sodium Heart Healthy Diet is recommended.  2. It is safe to use a laxative, such as Miralax or Colace, if you have difficulty moving your bowels. You can take Sennakot at bedtime every evening  to keep bowel movements regular and to prevent constipation.    Wound Care: 1. Keep clean and dry.  Shower daily.  Reasons to call the Doctor: Fever - Oral temperature greater than 100.4 degrees Fahrenheit Foul-smelling vaginal discharge Difficulty urinating Nausea and vomiting Increased pain at the site of the incision that is unrelieved with pain medicine. Difficulty breathing with or without chest pain New calf pain especially if only on one side Sudden, continuing increased vaginal bleeding with or without clots.   Contacts: For questions or concerns you should contact:  Dr. Jeral Pinch at [phone]  [name], NP at [phone]  After Hours: call [phone] and have the GYN Oncologist paged/contacted   Post Anesthesia Home Care Instructions  Activity: Get plenty of rest for the remainder of the day. A responsible individual must stay with you for 24 hours following the procedure.  For the next 24 hours, DO NOT: -Drive a car -Paediatric nurse -Drink alcoholic beverages -Take any medication unless instructed by your physician -Make any legal decisions or sign important papers.  Meals: Start with liquid foods such as gelatin or soup. Progress to regular foods as tolerated. Avoid greasy, spicy, heavy foods. If nausea and/or vomiting occur, drink only clear liquids until the nausea and/or vomiting subsides. Call your physician if vomiting continues.  Special Instructions/Symptoms: Your throat may feel dry or  sore from the anesthesia or the breathing tube placed in your throat during surgery. If this causes discomfort, gargle with warm salt water. The discomfort should disappear within 24 hours.  If you had a scopolamine patch placed behind your ear for the management of post- operative nausea and/or vomiting:  1. The medication in the patch is effective for 72 hours, after which it should be removed.  Wrap patch in a tissue and discard in the trash. Wash  hands thoroughly with soap and water. 2. You may remove the patch earlier than 72 hours if you experience unpleasant side effects which may include dry mouth, dizziness or visual disturbances. 3. Avoid touching the patch. Wash your hands with soap and water after contact with the patch.  Remove patch behind left ear by Friday,July 03, 2021. May take Tylenol as needed for pain starting at 4:30 PM today.

## 2021-07-01 ENCOUNTER — Telehealth: Payer: Self-pay

## 2021-07-01 ENCOUNTER — Encounter (HOSPITAL_BASED_OUTPATIENT_CLINIC_OR_DEPARTMENT_OTHER): Payer: Self-pay | Admitting: Gynecologic Oncology

## 2021-07-01 ENCOUNTER — Telehealth: Payer: Self-pay | Admitting: Gynecologic Oncology

## 2021-07-01 NOTE — Anesthesia Postprocedure Evaluation (Signed)
Anesthesia Post Note  Patient: Victoria Bush  Procedure(s) Performed: EXAM UNDER ANESTHESIA (Vagina ) vaginectomy, partial aborted vaginal biopsy (Vagina ) aborted CO2 laser application (Vagina )     Patient location during evaluation: PACU Anesthesia Type: General Level of consciousness: awake and alert Pain management: pain level controlled Vital Signs Assessment: post-procedure vital signs reviewed and stable Respiratory status: spontaneous breathing, nonlabored ventilation, respiratory function stable and patient connected to nasal cannula oxygen Cardiovascular status: blood pressure returned to baseline and stable Postop Assessment: no apparent nausea or vomiting Anesthetic complications: no   No notable events documented.  Last Vitals:  Vitals:   06/30/21 1445 06/30/21 1537  BP: 131/90 (!) 158/87  Pulse: 70 71  Resp: 18 16  Temp:  36.4 C  SpO2: 98% 96%    Last Pain:  Vitals:   06/30/21 1537  TempSrc:   PainSc: 0-No pain                 [name]

## 2021-07-01 NOTE — Telephone Encounter (Signed)
Following up with Ms. Radi. Per [name], NP there was no evidence of yeast during surgery yesterday. This could be from the prep used during the procedure or drainage from the vagina. Advised patient to rinse the area well so drainage is not staying on skin and pat dry. Do this several times a day. Avoid having a wet pad on. Please call the office if symptoms persist. Patient verbalized understanding.   MRI has been scheduled for 07/08/21 at 3 pm at [facility]. Patient to arrive by 2:30 pm. Patient aware of appointment.  Instructed to call with any needs.

## 2021-07-01 NOTE — Telephone Encounter (Signed)
Called patient - discussed findings at surgery yesterday, procedures performed. Plan for MRI to assess why purulent discharge noted. No growth from cultures at this time, will only treat with antibiotics if organisms seen. I suspect may represent long-standing abscess that occurred after her hysterectomy. Also took additional vaginal biopsies, will notify her with those results when I have them.  Jeral Pinch MD Gynecologic Oncology

## 2021-07-01 NOTE — Telephone Encounter (Signed)
Spoke with Ms. Seel this morning. She states she is eating, drinking and urinating well. She has not had a BM yet but is passing gas. She has not taken senokot. Encouraged her to drink plenty of water and take senokot this evening, hold for loose stools. She denies fever or chills. She rates her pain 0/10, she has not needed to take any pain medicine.  She endorses vaginal discharge and itching. She would like something called into [facility] for this. "There is a constant wetness." The discharge is white and thin in consistency. No odor, no bleeding. Itching is within her vagina and the entrance/outside of her vagina. Denies urinary symptoms. She reports this has happened in the past after a procedure with Dr. Gaetano Net. "They called something into the pharmacy and it worked but I can't remember what it was."  Advised patient that MD will be notified and someone from the office will call her back. Patient verbalized understanding.

## 2021-07-02 LAB — SURGICAL PATHOLOGY

## 2021-07-05 LAB — AEROBIC/ANAEROBIC CULTURE W GRAM STAIN (SURGICAL/DEEP WOUND): Culture: NO GROWTH

## 2021-07-08 ENCOUNTER — Ambulatory Visit (HOSPITAL_COMMUNITY)
Admission: RE | Admit: 2021-07-08 | Discharge: 2021-07-08 | Disposition: A | Discharge status: Home / Self Care | Source: Ambulatory Visit | Attending: Gynecologic Oncology | Admitting: Gynecologic Oncology

## 2021-07-08 ENCOUNTER — Other Ambulatory Visit: Payer: Self-pay

## 2021-07-08 DIAGNOSIS — L0291 Cutaneous abscess, unspecified: Secondary | ICD-10-CM | POA: Diagnosis not present

## 2021-07-08 MED ORDER — GADOBUTROL 1 MMOL/ML IV SOLN
7.5000 mL | Freq: Once | INTRAVENOUS | Status: AC | PRN
  Administered 2021-07-08: 7.5 mL via INTRAVENOUS

## 2021-07-13 ENCOUNTER — Telehealth: Payer: Self-pay

## 2021-07-13 ENCOUNTER — Telehealth: Payer: Self-pay | Admitting: Internal Medicine

## 2021-07-13 NOTE — Telephone Encounter (Signed)
Victoria Bush [phone]  Estephania called to see if she could just schedule an appointment for labs, I ask her what labs she was wanting and she said maybe some labs like she would get for a physical. I let her know we don't just schedule lab appointments for physicals unless you have a physical appointment scheduled and if you need other labs you would need to see doctor first. I let her know we were scheduling CPE's into June and she stated that would not work for her and she hung up.

## 2021-07-13 NOTE — Telephone Encounter (Signed)
Received call from Ms. Alonzo this afternoon. Patient is following up on the results of her MRI. She states " I saw Dr. Lulu Riding message about traveling today, when do you think she will call me?" Advised patient that Dr. Berline Lopes is traveling today and will be in surgery tomorrow but the message will be given to her. Patient verbalized  understanding.

## 2021-07-15 ENCOUNTER — Telehealth: Payer: Self-pay | Admitting: Gynecologic Oncology

## 2021-07-15 NOTE — Telephone Encounter (Signed)
Called patient to discuss MRI results.  Small area of proteinaceous fluid collection at the cuff.  Very small, measuring less than 2 cm.  I suspect that it was double or triple this in size prior to drainage at the time of her exam under anesthesia.  Also discussed biopsy results from recent surgery which show ulceration, no dysplasia.  In terms of how we proceed, I offered her 2 treatment options.  The first would be to proceed with laser therapy as we had initially planned for treatment of high-grade dysplasia.  While I am somewhat reassured that subsequent biopsies from recent surgery were negative for dysplasia, I cannot say that there is not some underlying high-grade dysplasia left.  Laser therapy would allow for treatment of this high-grade dysplasia.  The other option would be close follow-up with an exam and likely biopsies in 6 months.  After discussing the risk and benefits of both options, the patient would like to proceed with laser therapy of the vagina.  I will ask Melissa cross to call the patient tomorrow to discuss dates for surgery.  Jeral Pinch MD Gynecologic Oncology

## 2021-07-16 ENCOUNTER — Telehealth: Payer: Self-pay

## 2021-07-16 NOTE — Telephone Encounter (Signed)
Spoke with Ms. Schobert this afternoon regarding surgery scheduling. Offered patient surgery date of 07/28/21 at [facility]. Patient is agreeable to this date and would like to proceed with scheduling the procedure. Advised patient that the surgery center will contact her 24-48 hours in advance of surgery to let her know the time of the procedure. Patient verbalized understanding. Instructed to call with any needs.

## 2021-07-22 ENCOUNTER — Other Ambulatory Visit: Payer: Self-pay

## 2021-07-22 ENCOUNTER — Encounter (HOSPITAL_BASED_OUTPATIENT_CLINIC_OR_DEPARTMENT_OTHER): Payer: Self-pay | Admitting: Gynecologic Oncology

## 2021-07-22 NOTE — Progress Notes (Signed)
Spoke w/ via phone for pre-op interview---pt Lab needs dos----none             Lab results------none COVID test -----patient states asymptomatic no test needed Arrive at -------0530 on Tuesday, 07/28/21 NPO after MN NO Solid Food.  Clear liquids from MN until---0430 Med rec completed Medications to take morning of surgery ----- none Diabetic medication -----n/a Patient instructed no nail polish to be worn day of surgery Patient instructed to bring photo id and insurance card day of surgery Patient aware to have Driver (ride ) / caregiver    for 24 hours after surgery - husband Awanda Mink Patient Special Instructions -----none Pre-Op special Istructions -----none Patient verbalized understanding of instructions that were given at this phone interview. Patient denies shortness of breath, chest pain, fever, cough at this phone interview.   Patient was at [facility] for surgery on 06/30/21. She stated no changes in medical history, allergies or medications since then.

## 2021-07-27 ENCOUNTER — Telehealth: Payer: Self-pay

## 2021-07-27 NOTE — Telephone Encounter (Signed)
Telephone call to check on pre-operative status.  Patient compliant with pre-operative instructions.  Reinforced nothing to eat after midnight. Clear liquids until 0430. Patient to arrive at 0530.  No questions or concerns voiced.  Instructed to call for any needs.

## 2021-07-28 ENCOUNTER — Ambulatory Visit (HOSPITAL_BASED_OUTPATIENT_CLINIC_OR_DEPARTMENT_OTHER)
Admission: RE | Admit: 2021-07-28 | Discharge: 2021-07-28 | Disposition: A | Discharge status: Home / Self Care | Attending: Gynecologic Oncology | Admitting: Gynecologic Oncology

## 2021-07-28 ENCOUNTER — Ambulatory Visit (HOSPITAL_BASED_OUTPATIENT_CLINIC_OR_DEPARTMENT_OTHER): Admitting: Anesthesiology

## 2021-07-28 ENCOUNTER — Other Ambulatory Visit: Payer: Self-pay

## 2021-07-28 ENCOUNTER — Encounter (HOSPITAL_BASED_OUTPATIENT_CLINIC_OR_DEPARTMENT_OTHER): Payer: Self-pay | Admitting: Gynecologic Oncology

## 2021-07-28 ENCOUNTER — Encounter (HOSPITAL_BASED_OUTPATIENT_CLINIC_OR_DEPARTMENT_OTHER)
Admission: RE | Disposition: A | Discharge status: Home / Self Care | Payer: Self-pay | Source: Home / Self Care | Attending: Gynecologic Oncology

## 2021-07-28 DIAGNOSIS — Z90710 Acquired absence of both cervix and uterus: Secondary | ICD-10-CM | POA: Insufficient documentation

## 2021-07-28 DIAGNOSIS — D072 Carcinoma in situ of vagina: Secondary | ICD-10-CM | POA: Diagnosis not present

## 2021-07-28 DIAGNOSIS — N7689 Other specified inflammation of vagina and vulva: Secondary | ICD-10-CM | POA: Diagnosis not present

## 2021-07-28 DIAGNOSIS — N891 Moderate vaginal dysplasia: Secondary | ICD-10-CM | POA: Insufficient documentation

## 2021-07-28 DIAGNOSIS — N893 Dysplasia of vagina, unspecified: Secondary | ICD-10-CM | POA: Diagnosis not present

## 2021-07-28 HISTORY — PX: CO2 LASER APPLICATION: SHX5778

## 2021-07-28 SURGERY — CO2 LASER APPLICATION
Anesthesia: General | Site: Vagina

## 2021-07-28 MED ORDER — DEXAMETHASONE SODIUM PHOSPHATE 10 MG/ML IJ SOLN
INTRAMUSCULAR | Status: AC
  Filled 2021-07-28: qty 1

## 2021-07-28 MED ORDER — LIDOCAINE HCL (CARDIAC) PF 100 MG/5ML IV SOSY
PREFILLED_SYRINGE | INTRAVENOUS | Status: DC | PRN
  Administered 2021-07-28: 100 mg via INTRAVENOUS

## 2021-07-28 MED ORDER — SCOPOLAMINE 1 MG/3DAYS TD PT72
MEDICATED_PATCH | TRANSDERMAL | Status: AC
  Filled 2021-07-28: qty 1

## 2021-07-28 MED ORDER — ONDANSETRON HCL 4 MG/2ML IJ SOLN: 4.0000 mg | Freq: Once | INTRAMUSCULAR | Status: DC | PRN

## 2021-07-28 MED ORDER — ACETAMINOPHEN 500 MG PO TABS
ORAL_TABLET | ORAL | Status: AC
  Filled 2021-07-28: qty 2

## 2021-07-28 MED ORDER — OXYCODONE HCL 5 MG/5ML PO SOLN: 5.0000 mg | Freq: Once | ORAL | Status: DC | PRN

## 2021-07-28 MED ORDER — KETOROLAC TROMETHAMINE 30 MG/ML IJ SOLN: 30.0000 mg | Freq: Once | INTRAMUSCULAR | Status: DC | PRN

## 2021-07-28 MED ORDER — LIDOCAINE HCL (PF) 2 % IJ SOLN
INTRAMUSCULAR | Status: AC
  Filled 2021-07-28: qty 5

## 2021-07-28 MED ORDER — CELECOXIB 200 MG PO CAPS
ORAL_CAPSULE | ORAL | Status: AC
  Filled 2021-07-28: qty 1

## 2021-07-28 MED ORDER — OXYCODONE HCL 5 MG PO TABS: 5.0000 mg | ORAL_TABLET | Freq: Once | ORAL | Status: DC | PRN

## 2021-07-28 MED ORDER — MIDAZOLAM HCL 5 MG/5ML IJ SOLN
INTRAMUSCULAR | Status: DC | PRN
Start: 2021-07-28 — End: 2021-07-28
  Administered 2021-07-28: 2 mg via INTRAVENOUS

## 2021-07-28 MED ORDER — ACETIC ACID 5 % SOLN
Status: DC | PRN
  Administered 2021-07-28: 1 via TOPICAL

## 2021-07-28 MED ORDER — ONDANSETRON HCL 4 MG/2ML IJ SOLN
INTRAMUSCULAR | Status: DC | PRN
  Administered 2021-07-28: 4 mg via INTRAVENOUS

## 2021-07-28 MED ORDER — MIDAZOLAM HCL 2 MG/2ML IJ SOLN
INTRAMUSCULAR | Status: AC
  Filled 2021-07-28: qty 2

## 2021-07-28 MED ORDER — SILVER SULFADIAZINE 1 % EX CREA
TOPICAL_CREAM | CUTANEOUS | Status: DC | PRN
Start: 2021-07-28 — End: 2021-07-28
  Administered 2021-07-28: 1 via TOPICAL

## 2021-07-28 MED ORDER — LACTATED RINGERS IV SOLN: INTRAVENOUS | Status: DC

## 2021-07-28 MED ORDER — ONDANSETRON HCL 4 MG/2ML IJ SOLN
INTRAMUSCULAR | Status: AC
  Filled 2021-07-28: qty 2

## 2021-07-28 MED ORDER — SCOPOLAMINE 1 MG/3DAYS TD PT72
1.0000 | MEDICATED_PATCH | TRANSDERMAL | Status: DC
  Administered 2021-07-28: 1.5 mg via TRANSDERMAL

## 2021-07-28 MED ORDER — ACETAMINOPHEN 500 MG PO TABS
1000.0000 mg | ORAL_TABLET | ORAL | Status: AC
  Administered 2021-07-28: 1000 mg via ORAL

## 2021-07-28 MED ORDER — PROPOFOL 10 MG/ML IV BOLUS
INTRAVENOUS | Status: DC | PRN
  Administered 2021-07-28: 200 mg via INTRAVENOUS

## 2021-07-28 MED ORDER — STERILE WATER FOR IRRIGATION IR SOLN
Status: DC | PRN
  Administered 2021-07-28: 1000 mL

## 2021-07-28 MED ORDER — LIDOCAINE HCL 1 % IJ SOLN
INTRAMUSCULAR | Status: DC | PRN
  Administered 2021-07-28: 10 mL

## 2021-07-28 MED ORDER — FENTANYL CITRATE (PF) 100 MCG/2ML IJ SOLN: 25.0000 ug | INTRAMUSCULAR | Status: DC | PRN

## 2021-07-28 MED ORDER — FENTANYL CITRATE (PF) 100 MCG/2ML IJ SOLN
INTRAMUSCULAR | Status: DC | PRN
  Administered 2021-07-28: 100 ug via INTRAVENOUS

## 2021-07-28 MED ORDER — DEXAMETHASONE SODIUM PHOSPHATE 10 MG/ML IJ SOLN
4.0000 mg | INTRAMUSCULAR | Status: AC
  Administered 2021-07-28: 8 mg via INTRAVENOUS

## 2021-07-28 MED ORDER — FENTANYL CITRATE (PF) 100 MCG/2ML IJ SOLN
INTRAMUSCULAR | Status: AC
  Filled 2021-07-28: qty 2

## 2021-07-28 MED ORDER — CELECOXIB 200 MG PO CAPS
200.0000 mg | ORAL_CAPSULE | ORAL | Status: AC
  Administered 2021-07-28: 200 mg via ORAL

## 2021-07-28 MED ORDER — IODINE STRONG (LUGOLS) 5 % PO SOLN
ORAL | Status: DC | PRN
  Administered 2021-07-28: 1 mL

## 2021-07-28 MED ORDER — PROPOFOL 10 MG/ML IV BOLUS
INTRAVENOUS | Status: AC
  Filled 2021-07-28: qty 20

## 2021-07-28 SURGICAL SUPPLY — 35 items
BLADE SURG 15 STRL LF DISP TIS (BLADE) IMPLANT
BLADE SURG 15 STRL SS (BLADE)
CANISTER SUCT 1200ML W/VALVE (MISCELLANEOUS) IMPLANT
CATH ROBINSON RED A/P 16FR (CATHETERS) IMPLANT
DEPRESSOR TONGUE BLADE STERILE (MISCELLANEOUS) ×3 IMPLANT
DRSG TELFA 3X8 NADH (GAUZE/BANDAGES/DRESSINGS) IMPLANT
ELECT BALL LEEP 3MM BLK (ELECTRODE) IMPLANT
GAUZE 4X4 16PLY ~~LOC~~+RFID DBL (SPONGE) ×3 IMPLANT
GLOVE SURG ENC MOIS LTX SZ6 (GLOVE) ×3 IMPLANT
GOWN STRL REUS W/TWL LRG LVL3 (GOWN DISPOSABLE) ×3 IMPLANT
KIT TURNOVER CYSTO (KITS) ×3 IMPLANT
NDL HYPO 25X1 1.5 SAFETY (NEEDLE) IMPLANT
NEEDLE HYPO 25X1 1.5 SAFETY (NEEDLE) IMPLANT
NS IRRIG 500ML POUR BTL (IV SOLUTION) IMPLANT
PACK VAGINAL WOMENS (CUSTOM PROCEDURE TRAY) ×3 IMPLANT
PAD DRESSING TELFA 3X8 NADH (GAUZE/BANDAGES/DRESSINGS) IMPLANT
PAD PREP 24X48 CUFFED NSTRL (MISCELLANEOUS) ×3 IMPLANT
PUNCH BIOPSY DERMAL 3 (INSTRUMENTS) IMPLANT
PUNCH BIOPSY DERMAL 3MM (INSTRUMENTS)
PUNCH BIOPSY DERMAL 4MM (INSTRUMENTS) IMPLANT
SUT VIC AB 0 CT1 36 (SUTURE) IMPLANT
SUT VIC AB 2-0 SH 27 (SUTURE)
SUT VIC AB 2-0 SH 27XBRD (SUTURE) IMPLANT
SUT VIC AB 3-0 PS2 18 (SUTURE)
SUT VIC AB 3-0 PS2 18XBRD (SUTURE) IMPLANT
SUT VIC AB 3-0 SH 27 (SUTURE)
SUT VIC AB 3-0 SH 27X BRD (SUTURE) IMPLANT
SUT VIC AB 4-0 PS2 18 (SUTURE) IMPLANT
SUT VICRYL 0 UR6 27IN ABS (SUTURE) IMPLANT
SWAB OB GYN 8IN STERILE 2PK (MISCELLANEOUS) ×6 IMPLANT
TOWEL OR 17X26 10 PK STRL BLUE (TOWEL DISPOSABLE) ×3 IMPLANT
TUBE CONNECTING 12X1/4 (SUCTIONS) IMPLANT
VACUUM HOSE 7/8X10 W/ WAND (MISCELLANEOUS) IMPLANT
WATER STERILE IRR 1000ML UROMA (IV SOLUTION) ×1 IMPLANT
WATER STERILE IRR 500ML POUR (IV SOLUTION) ×3 IMPLANT

## 2021-07-28 NOTE — H&P (Signed)
Gynecologic Oncology Return Clinic Visit  07/28/21  Treatment History: Patient was seen for her annual visit on 11/21.  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  Patient was then seen for colposcopy on 12/2 with findings of some acetowhite changes and biopsy performed.  Biopsy with a diagnosis of benign squamous mucosa, no atypia, dysplasia, or malignancy.   Patient endorses a remote history of abnormal Pap smears in her teens.  She notes at one point having cryotherapy.  Subsequent Pap smears for many years were normal.  She underwent total abdominal hysterectomy with BSO in 2014 for uterine fibroids.  Findings at the time of surgery were also pertinent for endometriosis, confirmed on final pathology.  06/12/21: vaginal cuff biopsy - VAIN3/CIS   06/30/21: EUA, plan for laser of the vagina but findings of what appeared to be an abscess at the vagina apex. Biopsies and cultures taken. Biopsies showed predominantly ulcerated/denuded mucosa; no dysplasia or malignancy. Cultures with no growth.  07/08/21: 1.8x1.2cm hemorrhagic or proteinaceous fluid collection at apex of vaginal cuff.  Interval History: Doing well. No new complaints.  Past Medical/Surgical History: Past Medical History:  Diagnosis Date   Anemia    Migraine    no aura; lifetime history   Vaginal dysplasia    high grade   Wears glasses     Past Surgical History:  Procedure Laterality Date   ABDOMINAL HYSTERECTOMY  06/19/2012   Procedure: HYSTERECTOMY ABDOMINAL;  Surgeon: Victoria Katz, MD;  Location: [facility];  Service: Gynecology;  Laterality: N/A;   breast cystectomy bilateral     yrs ago   CO2 LASER APPLICATION N/A 7/82/4235   Procedure: aborted CO2 laser application;  Surgeon: Victoria Mosses, MD;  Location: [facility];  Service: Gynecology;  Laterality: N/A;   colonscopy     few yrs ago   [place]     x2, laparotomy yrs ago   SALPINGOOPHORECTOMY  06/19/2012   Procedure: SALPINGO OOPHORECTOMY;  Surgeon: Victoria Millet II, MD;  Location: [facility];  Service: Gynecology;  Laterality: Bilateral;   VAGINECTOMY, PARTIAL N/A 06/30/2021   Procedure: vaginectomy, partial aborted vaginal biopsy;  Surgeon: Victoria Mosses, MD;  Location: [facility];  Service: Gynecology;  Laterality: N/A;   WISDOM TOOTH EXTRACTION     yrs ago    Family History  Problem Relation Age of Onset   Cancer Mother    Esophageal cancer Mother    Esophageal cancer Father    Mental illness Sister    Colon cancer Neg Hx    Colon polyps Neg Hx    Breast cancer Neg Hx    Ovarian cancer Neg Hx    Endometrial cancer Neg Hx    Pancreatic cancer Neg Hx    Prostate cancer Neg Hx     Social History   Socioeconomic History   Marital status: Single    Spouse name: Not on file   Number of children: Not on file   Years of education: Not on file   Highest education level: Not on file  Occupational History   Occupation: part-time, event planning  Tobacco Use   Smoking status: Never   Smokeless tobacco: Never  Vaping Use   Vaping Use: Never used  Substance and Sexual Activity   Alcohol use: Yes    Alcohol/week: 0.0 standard drinks    Comment: rarely   Drug use: No  Sexual activity: Yes  Other Topics Concern   Not on file  Social History Narrative   Not on file   Social Determinants of Health   Financial Resource Strain: Not on file  Food Insecurity: Not on file  Transportation Needs: Not on file  Physical Activity: Not on file  Stress: Not on file  Social Connections: Not on file    Current Medications:  Current Facility-Administered Medications:    dexamethasone (DECADRON) injection 4 mg, 4 mg, Intravenous, On Call to OR, Victoria Bush, Victoria Massed, NP   lactated ringers infusion, , Intravenous, Continuous, Victoria Boston, MD, Last Rate: 50 mL/hr at 07/28/21 0614, New Bag at  07/28/21 0614   scopolamine (TRANSDERM-SCOP) 1 MG/3DAYS 1.5 mg, 1 patch, Transdermal, On Call to OR, Bush, Victoria D, NP, 1.5 mg at 07/28/21 0559  Review of Systems: Denies appetite changes, fevers, chills, fatigue, unexplained weight changes. Denies hearing loss, neck lumps or masses, mouth sores, ringing in ears or voice changes. Denies cough or wheezing.  Denies shortness of breath. Denies chest pain or palpitations. Denies leg swelling. Denies abdominal distention, pain, blood in stools, constipation, diarrhea, nausea, vomiting, or early satiety. Denies pain with intercourse, dysuria, frequency, hematuria or incontinence. Denies hot flashes, pelvic pain, vaginal bleeding or vaginal discharge.   Denies joint pain, back pain or muscle pain/cramps. Denies itching, rash, or wounds. Denies dizziness, headaches, numbness or seizures. Denies swollen lymph nodes or glands, denies easy bruising or bleeding. Denies anxiety, depression, confusion, or decreased concentration.  Physical Exam: BP (!) 162/96    Pulse 81    Temp 97.9 F (36.6 C) (Oral)    Resp 16    Ht 4\' 11"  (1.499 m)    Wt 172 lb 3.2 oz (78.1 kg)    SpO2 100%    BMI 34.78 kg/m  General: Alert, oriented, no acute distress.  HEENT: Normocephalic, atraumatic. Sclera anicteric.  Chest: Clear to auscultation bilaterally. No wheezes, rhonchi, or rales. Cardiovascular: Regular rate and rhythm, no murmurs, rubs, or gallops.  Abdomen: Obese. Normoactive bowel sounds. Soft, nondistended, nontender to palpation. No masses or hepatosplenomegaly appreciated. No palpable fluid wave.  Extremities: Grossly normal range of motion. Warm, well perfused. No edema bilaterally.  Well-healed midline laparotomy incision. Skin: No rashes or lesions.  Lymphatics: No cervical, supraclavicular, or inguinal adenopathy.   Laboratory & Radiologic Studies: None new  Assessment & Plan: Victoria Bush is a 61 y.o. woman with vaginal dysplasia presenting  for treatment with CO2 laser ablation.  Victoria Pinch, MD  Division of Gynecologic Oncology  Department of Obstetrics and Gynecology  [facility]

## 2021-07-28 NOTE — Interval H&P Note (Signed)
History and Physical Interval Note:  07/28/2021 6:46 AM  Victoria Bush  has presented today for surgery, with the diagnosis of HIGH GRADE VAGINAL DYSPLACIA.  The various methods of treatment have been discussed with the patient and family. After consideration of risks, benefits and other options for treatment, the patient has consented to  Procedure(s): CO2 LASER OF VAGINA (N/A) as a surgical intervention.  The patient's history has been reviewed, patient examined, no change in status, stable for surgery.  I have reviewed the patient's chart and labs.  Questions were answered to the patient's satisfaction.     [name]

## 2021-07-28 NOTE — Anesthesia Preprocedure Evaluation (Signed)
Anesthesia Evaluation  Patient identified by MRN, date of birth, ID band Patient awake    Reviewed: Allergy & Precautions, NPO status , Patient's Chart, lab work & pertinent test results  Airway Mallampati: II  TM Distance: >3 FB Neck ROM: Full    Dental no notable dental hx.    Pulmonary neg pulmonary ROS,    Pulmonary exam normal breath sounds clear to auscultation       Cardiovascular negative cardio ROS Normal cardiovascular exam Rhythm:Regular Rate:Normal     Neuro/Psych  Headaches, negative psych ROS   GI/Hepatic negative GI ROS, Neg liver ROS,   Endo/Other  negative endocrine ROS  Renal/GU negative Renal ROS  negative genitourinary   Musculoskeletal negative musculoskeletal ROS (+)   Abdominal   Peds negative pediatric ROS (+)  Hematology negative hematology ROS (+)   Anesthesia Other Findings   Reproductive/Obstetrics negative OB ROS                             Anesthesia Physical Anesthesia Plan  ASA: 1  Anesthesia Plan: General   Post-op Pain Management: Minimal or no pain anticipated   Induction: Intravenous  PONV Risk Score and Plan: 3 and Ondansetron, Dexamethasone, Midazolam and Treatment may vary due to age or medical condition  Airway Management Planned: LMA  Additional Equipment:   Intra-op Plan:   Post-operative Plan: Extubation in OR  Informed Consent: I have reviewed the patients History and Physical, chart, labs and discussed the procedure including the risks, benefits and alternatives for the proposed anesthesia with the patient or authorized representative who has indicated his/her understanding and acceptance.     Dental advisory given  Plan Discussed with: CRNA and Surgeon  Anesthesia Plan Comments:         Anesthesia Quick Evaluation

## 2021-07-28 NOTE — Transfer of Care (Signed)
Immediate Anesthesia Transfer of Care Note  Patient: Victoria Bush  Procedure(s) Performed: CO2 LASER OF VAGINA (Vagina )  Patient Location: PACU  Anesthesia Type:General  Level of Consciousness: awake, alert , oriented and patient cooperative  Airway & Oxygen Therapy: Patient Spontanous Breathing and Patient connected to nasal cannula oxygen  Post-op Assessment: Report given to RN, Post -op Vital signs reviewed and stable and Patient moving all extremities X 4  Post vital signs: Reviewed and stable  Last Vitals:  Vitals Value Taken Time  BP 102/61 07/28/21 0802  Temp    Pulse    Resp 18 07/28/21 0804  SpO2    Vitals shown include unvalidated device data.  Last Pain:  Vitals:   07/28/21 0554  TempSrc: Oral  PainSc: 0-No pain      Patients Stated Pain Goal: 8 (56/38/75 6433)  Complications: No notable events documented.

## 2021-07-28 NOTE — Op Note (Signed)
PATIENT: Victoria Bush  ENCOUNTER DATE: 07/28/21  Preop Diagnosis: VAIN, vaginal cuff fluid collection  Postoperative Diagnosis: same as above  Surgery: CO2 laser of the vagina  Surgeons:  Valarie Cones, MD  Assistant: none  Anesthesia: General   Estimated blood loss: < 5 ml  IVF:  see I&O flowsheet   Urine output: 40 ml   Complications: None apparent  Pathology: none  Operative findings: Smooth vaginal cuff on EUA. After application of Lugols, mildly decreased uptake along vaginal cuff.   Procedure: The patient was identified in the preoperative holding area. Informed consent was signed on the chart. Patient was seen history was reviewed and exam was performed.   The patient was then taken to the operating room and placed in the supine position with SCD hose on. General anesthesia was then induced without difficulty. She was then placed in the dorsolithotomy position. The perineum was prepped with Betadine. The vagina was prepped with Betadine. The patient was then draped after the prep was dried. A Foley catheter was inserted into the bladder under sterile conditions to drain the bladder then removed.  Timeout was performed the patient, procedure, antibiotic, allergy, and length of procedure. Lugols was applied to the vagina with findings noted above. The submucosal tissues were infiltrated with 1% lidocaine.   The patient's surgical field was draped with wet towels. The staff and patient ensured laser-safe eyewear and masks were fitted. The laser was set to 8 watts continuous. The laser was tested for accuracy on a tongue depressor.  The laser was applied to the circumscribed area of the vaginal cuff that had been previously identified. The tissue was ablated to the desired depth and the eschar was removed with a moistened sponge. When the entire lesion had been ablated the procedure was complete.  Silvadine cream was applied to the laser site.  All instrument, suture,  laparotomy, Ray-Tec, and needle counts were correct x2. The patient tolerated the procedure well and was taken recovery room in stable condition.   Jeral Pinch MD Gynecologic Oncology

## 2021-07-28 NOTE — Anesthesia Procedure Notes (Signed)
Procedure Name: LMA Insertion Date/Time: 07/28/2021 7:34 AM Performed by: Jonna Munro, CRNA Pre-anesthesia Checklist: Patient identified, Emergency Drugs available, Suction available, Patient being monitored and Timeout performed Patient Re-evaluated:Patient Re-evaluated prior to induction Oxygen Delivery Method: Circle system utilized Preoxygenation: Pre-oxygenation with 100% oxygen Induction Type: IV induction LMA: LMA inserted LMA Size: 3.0 Number of attempts: 1 Placement Confirmation: ETT inserted through vocal cords under direct vision, positive ETCO2 and breath sounds checked- equal and bilateral Tube secured with: Tape Dental Injury: Teeth and Oropharynx as per pre-operative assessment

## 2021-07-28 NOTE — Discharge Instructions (Addendum)
Post Operative Instructions Following Laser Surgery  Laser treatment of the vagina to treat precancer.  The laser actually creates a burn effect on the skin to accomplish this.  The following instructions will help in you comfort postoperatively:  First 24 h Use tylenol and ibuprofen as needed. You may eat whatever you feel like.  Start with a light meal and gradually advance your diet.    Beginning the day after your surgery You may have some vaginal discharge or drainage. Drainage is usually a pink to tan color and is normal for the following 2-3 weeks after surgery. You can use a peripad to help collect any drainage.  Diet Eat a regular diet.   Beginning the day after surgery, drink 6-8 glasses of water a day in addition to your meals.  Limit you caffeine intake to 1-2 servings per day.  Bowel Habits If you are constipated, you may take a Fleet enema or 2 Dulcolax tablets.  Call the office if no results occur. You may bear down a normal amount to have a bowel movement without hurting your tissues after the operation.  Activity Walking is encouraged.  You may drive if you are not taking any prescription pain medication.  No heavy lifting or strenuous activity for 1-2 weeks after surgery.   Call the office if you have any questions or concerns.  Call IMMEDIATELY if you develop: Fever greater than 100 F Difficulty urinating    May take Tylenol after 1200 PM if needed    Post Anesthesia Home Care Instructions  Activity: Get plenty of rest for the remainder of the day. A responsible individual must stay with you for 24 hours following the procedure.  For the next 24 hours, DO NOT: -Drive a car -Paediatric nurse -Drink alcoholic beverages -Take any medication unless instructed by your physician -Make any legal decisions or sign important papers.  Meals: Start with liquid foods such as gelatin or soup. Progress to regular foods as tolerated. Avoid greasy, spicy, heavy  foods. If nausea and/or vomiting occur, drink only clear liquids until the nausea and/or vomiting subsides. Call your physician if vomiting continues.  Special Instructions/Symptoms: Your throat may feel dry or sore from the anesthesia or the breathing tube placed in your throat during surgery. If this causes discomfort, gargle with warm salt water. The discomfort should disappear within 24 hours.  If you had a scopolamine patch placed behind your ear for the management of post- operative nausea and/or vomiting:  1. The medication in the patch is effective for 72 hours, after which it should be removed.  Wrap patch in a tissue and discard in the trash. Wash hands thoroughly with soap and water. 2. You may remove the patch earlier than 72 hours if you experience unpleasant side effects which may include dry mouth, dizziness or visual disturbances. 3. Avoid touching the patch. Wash your hands with soap and water after contact with the patch.

## 2021-07-28 NOTE — Anesthesia Postprocedure Evaluation (Signed)
Anesthesia Post Note  Patient: Victoria Bush  Procedure(s) Performed: CO2 LASER OF VAGINA (Vagina )     Patient location during evaluation: PACU Anesthesia Type: General Level of consciousness: awake and alert Pain management: pain level controlled Vital Signs Assessment: post-procedure vital signs reviewed and stable Respiratory status: spontaneous breathing, nonlabored ventilation, respiratory function stable and patient connected to nasal cannula oxygen Cardiovascular status: blood pressure returned to baseline and stable Postop Assessment: no apparent nausea or vomiting Anesthetic complications: no   No notable events documented.  Last Vitals:  Vitals:   07/28/21 0803 07/28/21 0815  BP: 102/61 120/78  Pulse:    Resp: (!) 22 (!) 22  Temp: 36.4 C   SpO2: 91% 96%    Last Pain:  Vitals:   07/28/21 0803  TempSrc:   PainSc: Asleep                 Denzel Etienne S

## 2021-07-29 ENCOUNTER — Encounter (HOSPITAL_BASED_OUTPATIENT_CLINIC_OR_DEPARTMENT_OTHER): Payer: Self-pay | Admitting: Gynecologic Oncology

## 2021-07-29 ENCOUNTER — Telehealth: Payer: Self-pay

## 2021-07-29 NOTE — Telephone Encounter (Signed)
Spoke with Victoria Bush this morning. She states she is eating, drinking and urinating well. She has not had a BM yet but is passing gas. She is taking senokot as prescribed and encouraged her to drink plenty of water. She denies fever or chills. Incisions are dry and intact. She rates her pain 0/10. Her pain is controlled with Ibuprofen as needed.  Instructed to call office with any fever, chills, purulent drainage, uncontrolled pain or any other questions or concerns. Patient verbalizes understanding.   Pt aware of post op appointments as well as the office number [phone] and after hours number [phone] to call if she has any questions or concerns

## 2021-08-03 ENCOUNTER — Encounter: Admitting: Gynecologic Oncology

## 2021-08-19 ENCOUNTER — Encounter: Payer: Self-pay | Admitting: Gynecologic Oncology

## 2021-08-24 ENCOUNTER — Other Ambulatory Visit: Payer: Self-pay

## 2021-08-24 ENCOUNTER — Inpatient Hospital Stay: Attending: Gynecologic Oncology | Admitting: Gynecologic Oncology

## 2021-08-24 ENCOUNTER — Encounter: Payer: Self-pay | Admitting: Gynecologic Oncology

## 2021-08-24 VITALS — BP 151/84 | HR 86 | Temp 98.4°F | Resp 16 | Ht <= 58 in | Wt 166.6 lb

## 2021-08-24 DIAGNOSIS — Z87410 Personal history of cervical dysplasia: Secondary | ICD-10-CM

## 2021-08-24 DIAGNOSIS — N893 Dysplasia of vagina, unspecified: Secondary | ICD-10-CM

## 2021-08-24 DIAGNOSIS — Z9079 Acquired absence of other genital organ(s): Secondary | ICD-10-CM

## 2021-08-24 DIAGNOSIS — Z7189 Other specified counseling: Secondary | ICD-10-CM

## 2021-08-24 NOTE — Progress Notes (Signed)
Gynecologic Oncology Return Clinic Visit ? ?08/24/2021 ? ?Reason for Visit: Follow-up after surgery, treatment planning ? ?Treatment History: ?Patient was seen for her annual visit on 11/21.  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  Patient was then seen for colposcopy on 12/2 with findings of some acetowhite changes and biopsy performed.  Biopsy with a diagnosis of benign squamous mucosa, no atypia, dysplasia, or malignancy. ?  ?Patient endorses a remote history of abnormal Pap smears in her teens.  She notes at one point having cryotherapy.  Subsequent Pap smears for many years were normal.  She underwent total abdominal hysterectomy with BSO in 2014 for uterine fibroids.  Findings at the time of surgery were also pertinent for endometriosis, confirmed on final pathology. ?  ?06/12/21: vaginal cuff biopsy - VAIN3/CIS ?  ?06/30/21: EUA, plan for laser of the vagina but findings of what appeared to be an abscess at the vagina apex. Biopsies and cultures taken. Biopsies showed predominantly ulcerated/denuded mucosa; no dysplasia or malignancy. Cultures with no growth. ?  ?07/08/21: 1.8x1.2cm hemorrhagic or proteinaceous fluid collection at apex of vaginal cuff. ? ?07/28/2021: CO2 laser of the vagina. ? ?Interval History: ?Doing well since surgery.  Denies any pelvic pain.  Denies any vaginal bleeding or discharge.  Bowel and bladder function are at baseline. ? ?Past Medical/Surgical History: ?Past Medical History:  ?Diagnosis Date  ? Anemia   ? Migraine   ? no aura; lifetime history  ? Vaginal dysplasia   ? high grade  ? Wears glasses   ? ? ?Past Surgical History:  ?Procedure Laterality Date  ? ABDOMINAL HYSTERECTOMY  06/19/2012  ? Procedure: HYSTERECTOMY ABDOMINAL;  Surgeon: Allena Katz, MD;  Location: [facility];  Service: Gynecology;  Laterality: N/A;  ? breast cystectomy bilateral    ? yrs ago  ? CO2 LASER APPLICATION N/A  7/51/7001  ? Procedure: aborted CO2 laser application;  Surgeon: [name], MD;  Location: [facility];  Service: Gynecology;  Laterality: N/A;  ? CO2 LASER APPLICATION N/A 7/49/4496  ? Procedure: CO2 LASER OF VAGINA;  Surgeon: [name], MD;  Location: [facility];  Service: Gynecology;  Laterality: N/A;  ? colonscopy    ? few yrs ago  ? MYOMECTOMY ABDOMINAL APPROACH    ? x2, laparotomy yrs ago  ? SALPINGOOPHORECTOMY  06/19/2012  ? Procedure: SALPINGO OOPHORECTOMY;  Surgeon: Allena Katz, MD;  Location: [facility];  Service: Gynecology;  Laterality: Bilateral;  ? VAGINECTOMY, PARTIAL N/A 06/30/2021  ? Procedure: vaginectomy, partial aborted vaginal biopsy;  Surgeon: [name], MD;  Location: [facility];  Service: Gynecology;  Laterality: N/A;  ? WISDOM TOOTH EXTRACTION    ? yrs ago  ? ? ?Family History  ?Problem Relation Age of Onset  ? Cancer Mother   ? Esophageal cancer Mother   ? Esophageal cancer Father   ? Mental illness Sister   ? Colon cancer Neg Hx   ? Colon polyps Neg Hx   ? Breast cancer Neg Hx   ? Ovarian cancer Neg Hx   ? Endometrial cancer Neg Hx   ? Pancreatic cancer Neg Hx   ? Prostate cancer Neg Hx   ? ? ?Social History  ? ?Socioeconomic History  ? Marital status: Single  ?  Spouse name: Not on file  ? Number of children: Not on file  ? Years of education: Not  on file  ? Highest education level: Not on file  ?Occupational History  ? Occupation: part-time, event planning  ?Tobacco Use  ? Smoking status: Never  ? Smokeless tobacco: Never  ?Vaping Use  ? Vaping Use: Never used  ?Substance and Sexual Activity  ? Alcohol use: Yes  ?  Alcohol/week: 0.0 standard drinks  ?  Comment: rarely  ? Drug use: No  ? Sexual activity: Yes  ?Other Topics Concern  ? Not on file  ?Social History Narrative  ? Not on file  ? ?Social Determinants of Health  ? ?Financial Resource Strain: Not on file  ?Food Insecurity: Not on file  ?Transportation  Needs: Not on file  ?Physical Activity: Not on file  ?Stress: Not on file  ?Social Connections: Not on file  ? ? ?Current Medications: ? ?Current Outpatient Medications:  ?  b complex vitamins tablet, Take 1 tablet by mouth daily., Disp: , Rfl:  ?  BIOTIN FORTE PO, Take by mouth daily., Disp: , Rfl:  ?  cholecalciferol (VITAMIN D) 1000 units tablet, Take 1,000 Units by mouth daily., Disp: , Rfl:  ?  ibuprofen (ADVIL) 800 MG tablet, Take 1 tablet (800 mg total) by mouth every 8 (eight) hours as needed for moderate pain. For AFTER surgery (Patient taking differently: Take 800 mg by mouth every 8 (eight) hours as needed for moderate pain.), Disp: 30 tablet, Rfl: 0 ?  Omega-3 Fatty Acids (FISH OIL) 1200 MG CAPS, Take 1,200 mg by mouth daily., Disp: , Rfl:  ?  vitamin C (ASCORBIC ACID) 500 MG tablet, Take 500 mg by mouth daily., Disp: , Rfl:  ?  vitamin E 1000 UNIT capsule, Take 1,000 Units by mouth daily., Disp: , Rfl:  ?  ferrous sulfate 325 (65 FE) MG tablet, Take 325 mg by mouth daily with breakfast. (Patient not taking: Reported on 06/25/2021), Disp: , Rfl:  ? ?Review of Systems: ?Denies appetite changes, fevers, chills, fatigue, unexplained weight changes. ?Denies hearing loss, neck lumps or masses, mouth sores, ringing in ears or voice changes. ?Denies cough or wheezing.  Denies shortness of breath. ?Denies chest pain or palpitations. Denies leg swelling. ?Denies abdominal distention, pain, blood in stools, constipation, diarrhea, nausea, vomiting, or early satiety. ?Denies pain with intercourse, dysuria, frequency, hematuria or incontinence. ?Denies hot flashes, pelvic pain, vaginal bleeding or vaginal discharge.   ?Denies joint pain, back pain or muscle pain/cramps. ?Denies itching, rash, or wounds. ?Denies dizziness, headaches, numbness or seizures. ?Denies swollen lymph nodes or glands, denies easy bruising or bleeding. ?Denies anxiety, depression, confusion, or decreased concentration. ? ?Physical Exam: ?BP  (!) 151/84 (BP Location: Left Arm, Patient Position: Sitting)   Pulse 86   Temp 98.4 ?F (36.9 ?C) (Oral)   Resp 16   Ht 4' 9.87" (1.47 m)   Wt 166 lb 9.6 oz (75.6 kg)   SpO2 100%   BMI 34.97 kg/m?  ?General: Alert, oriented, no acute distress. ?HEENT: Normocephalic, atraumatic, sclera anicteric. ?Chest: Unlabored breathing on room air. ?Extremities: Grossly normal range of motion.  Warm, well perfused.  No edema bilaterally. ? ?GU: Normal appearing external genitalia without erythema, excoriation, or lesions.  Speculum exam reveals mildly atrophic vaginal mucosa.  Cuff with evidence of recent laser along the midline into the left, healing well.  Bimanual exam reveals cuff smooth, no nodularity.   ? ?Laboratory & Radiologic Studies: ?None new ? ?Assessment & Plan: ?Victoria Bush is a 61 y.o. woman with high-grade vaginal dysplasia now status post CO2 laser  ablation. ? ?Patient is overall doing well and meeting all postoperative milestones. ? ?We reviewed again today findings from her initial surgery, additional biopsies that did not show any residual dysplasia, MRI findings of small proteinaceous fluid collection at the apex of the vagina.  When this was discovered at the time of her first surgery, culture sent and were negative for any active infection.  My suspicion is that this was an old fluid collection that developed after her hysterectomy. ? ?Discussed recommendations for surveillance in the setting of her dysplasia.  I recommend that she see her OB/GYN for visits every 6 months for for 2 years and then annually after.  Cotesting should be performed on an annual basis.  We also discussed signs and symptoms that should prompt a phone call to be seen sooner if she develops any of these between visits. ? ?26 minutes of total time was spent for this patient encounter, including preparation, face-to-face counseling with the patient and coordination of care, and documentation of the  encounter. ? ?Jeral Pinch, MD  ?Division of Gynecologic Oncology  ?Department of Obstetrics and Gynecology  ?[facility]  ? ?

## 2021-08-24 NOTE — Patient Instructions (Signed)
It was good to see you today.  You are healing well from surgery! ? ?I will send my note from today to your OB/GYN.  Given your vaginal precancer, I recommend close follow-up due to the risk of recurrence.  I recommend that you see your OB/GYN every 6 months for at least 2 years and then yearly after that.  You will have a Pap test done yearly. ?

## 2021-10-13 ENCOUNTER — Telehealth: Payer: Self-pay | Admitting: Internal Medicine

## 2021-11-24 ENCOUNTER — Ambulatory Visit (INDEPENDENT_AMBULATORY_CARE_PROVIDER_SITE_OTHER): Admitting: Family Medicine

## 2021-11-24 ENCOUNTER — Encounter: Payer: Self-pay | Admitting: Family Medicine

## 2021-11-24 VITALS — BP 150/98 | HR 77 | Temp 97.3°F | Ht 59.0 in | Wt 170.0 lb

## 2021-11-24 DIAGNOSIS — R5383 Other fatigue: Secondary | ICD-10-CM

## 2021-11-24 DIAGNOSIS — E559 Vitamin D deficiency, unspecified: Secondary | ICD-10-CM | POA: Diagnosis not present

## 2021-11-24 DIAGNOSIS — Z23 Encounter for immunization: Secondary | ICD-10-CM | POA: Diagnosis not present

## 2021-11-24 DIAGNOSIS — I10 Essential (primary) hypertension: Secondary | ICD-10-CM

## 2021-11-24 DIAGNOSIS — Z6379 Other stressful life events affecting family and household: Secondary | ICD-10-CM | POA: Diagnosis not present

## 2021-11-24 DIAGNOSIS — Z0000 Encounter for general adult medical examination without abnormal findings: Secondary | ICD-10-CM

## 2021-11-24 DIAGNOSIS — E782 Mixed hyperlipidemia: Secondary | ICD-10-CM | POA: Diagnosis not present

## 2021-11-24 LAB — COMPREHENSIVE METABOLIC PANEL
ALT: 12 U/L (ref 0–35)
AST: 18 U/L (ref 0–37)
Albumin: 4.1 g/dL (ref 3.5–5.2)
Alkaline Phosphatase: 103 U/L (ref 39–117)
BUN: 8 mg/dL (ref 6–23)
CO2: 27 mEq/L (ref 19–32)
Calcium: 9.6 mg/dL (ref 8.4–10.5)
Chloride: 104 mEq/L (ref 96–112)
Creatinine, Ser: 0.73 mg/dL (ref 0.40–1.20)
GFR: 90.08 mL/min (ref >60.00)
Glucose, Bld: 95 mg/dL (ref 70–99)
Potassium: 4 mEq/L (ref 3.5–5.1)
Sodium: 140 mEq/L (ref 135–145)
Total Bilirubin: 0.6 mg/dL (ref 0.2–1.2)
Total Protein: 7.6 g/dL (ref 6.0–8.3)

## 2021-11-24 LAB — LIPID PANEL
Cholesterol: 238 mg/dL — ABNORMAL HIGH (ref 0–200)
HDL: 46.8 mg/dL (ref >39.00)
LDL Cholesterol: 153 mg/dL — ABNORMAL HIGH (ref 0–99)
NonHDL: 190.81
Total CHOL/HDL Ratio: 5
Triglycerides: 187 mg/dL — ABNORMAL HIGH (ref 0.0–149.0)
VLDL: 37.4 mg/dL (ref 0.0–40.0)

## 2021-11-24 LAB — URINALYSIS, ROUTINE W REFLEX MICROSCOPIC
Bilirubin Urine: NEGATIVE
Hgb urine dipstick: NEGATIVE
Ketones, ur: NEGATIVE
Nitrite: NEGATIVE
RBC / HPF: NONE SEEN
Specific Gravity, Urine: 1.015 (ref 1.000–1.030)
Total Protein, Urine: NEGATIVE
Urine Glucose: NEGATIVE
Urobilinogen, UA: 0.2 (ref 0.0–1.0)
pH: 6.5 (ref 5.0–8.0)

## 2021-11-24 LAB — CBC
HCT: 40.9 % (ref 36.0–46.0)
Hemoglobin: 13.4 g/dL (ref 12.0–15.0)
MCHC: 32.8 g/dL (ref 30.0–36.0)
MCV: 80.8 fl (ref 78.0–100.0)
Platelets: 329 10*3/uL (ref 150.0–400.0)
RBC: 5.06 Mil/uL (ref 3.87–5.11)
RDW: 13.7 % (ref 11.5–15.5)
WBC: 6.1 10*3/uL (ref 4.0–10.5)

## 2021-11-24 LAB — TSH: TSH: 4.03 u[IU]/mL (ref 0.35–5.50)

## 2021-11-24 LAB — VITAMIN D 25 HYDROXY (VIT D DEFICIENCY, FRACTURES): VITD: 30.97 ng/mL (ref 30.00–100.00)

## 2021-11-24 MED ORDER — HYDROCHLOROTHIAZIDE 25 MG PO TABS
25.0000 mg | ORAL_TABLET | Freq: Every day | ORAL | 3 refills | Status: DC
Start: 2021-11-24 — End: 2022-02-05

## 2021-11-24 NOTE — Progress Notes (Signed)
New Patient Office Visit  Subjective    Patient ID: Victoria Bush, female    DOB: Oct 06, 1960  Age: 61 y.o. MRN: [id number]  CC:  Chief Complaint  Patient presents with   Establish Care    NP establish care. No questions/ concerns. Pt fasting    HPI Victoria Bush presents to establish care Physical exam and health check.  Review of the chart shows elevated LDL cholesterol and vitamin D deficiency.  Blood pressure was elevated in office visit in January and again in March of this year.  Ongoing treatment for vaginal intraepithelial neoplasia per GYN oncology.  She is walking for an hour most days weekly.  No regular dental care.  Increased stress at home secondary to husband's gastric cancer and his ongoing treatment.  Soon to finish his first course of therapy.  He is doing well.  Outpatient Encounter Medications as of 11/24/2021  Medication Sig   b complex vitamins tablet Take 1 tablet by mouth daily.   BIOTIN FORTE PO Take by mouth daily.   cholecalciferol (VITAMIN D) 1000 units tablet Take 1,000 Units by mouth daily.   ferrous sulfate 325 (65 FE) MG tablet Take 325 mg by mouth daily with breakfast.   hydrochlorothiazide (HYDRODIURIL) 25 MG tablet Take 1 tablet (25 mg total) by mouth daily.   Omega-3 Fatty Acids (FISH OIL) 1200 MG CAPS Take 1,200 mg by mouth daily.   vitamin C (ASCORBIC ACID) 500 MG tablet Take 500 mg by mouth daily.   vitamin E 1000 UNIT capsule Take 1,000 Units by mouth daily.   ibuprofen (ADVIL) 800 MG tablet Take 1 tablet (800 mg total) by mouth every 8 (eight) hours as needed for moderate pain. For AFTER surgery (Patient not taking: Reported on 11/24/2021)   No facility-administered encounter medications on file as of 11/24/2021.    Past Medical History:  Diagnosis Date   Anemia    Migraine    no aura; lifetime history   Vaginal dysplasia    high grade   Wears glasses     Past Surgical History:  Procedure Laterality Date   ABDOMINAL  HYSTERECTOMY  06/19/2012   Procedure: HYSTERECTOMY ABDOMINAL;  Surgeon: Allena Katz, MD;  Location: [facility];  Service: Gynecology;  Laterality: N/A;   breast cystectomy bilateral     yrs ago   CO2 LASER APPLICATION N/A 2/77/4128   Procedure: aborted CO2 laser application;  Surgeon: [name], MD;  Location: [facility];  Service: Gynecology;  Laterality: N/A;   CO2 LASER APPLICATION N/A 7/86/7672   Procedure: CO2 LASER OF VAGINA;  Surgeon: [name], MD;  Location: [facility];  Service: Gynecology;  Laterality: N/A;   colonscopy     few yrs ago   [place]     x2, laparotomy yrs ago   SALPINGOOPHORECTOMY  06/19/2012   Procedure: SALPINGO OOPHORECTOMY;  Surgeon: Shon Millet II, MD;  Location: [facility];  Service: Gynecology;  Laterality: Bilateral;   VAGINECTOMY, PARTIAL N/A 06/30/2021   Procedure: vaginectomy, partial aborted vaginal biopsy;  Surgeon: [name], MD;  Location: [facility];  Service: Gynecology;  Laterality: N/A;   WISDOM TOOTH EXTRACTION     yrs ago    Family History  Problem Relation Age of Onset   Cancer Mother    Esophageal cancer Mother    Esophageal cancer Father    Mental illness Sister    Diabetes Sister  Colon cancer Neg Hx    Colon polyps Neg Hx    Breast cancer Neg Hx    Ovarian cancer Neg Hx    Endometrial cancer Neg Hx    Pancreatic cancer Neg Hx    Prostate cancer Neg Hx     Social History   Socioeconomic History   Marital status: Single    Spouse name: Not on file   Number of children: Not on file   Years of education: Not on file   Highest education level: Not on file  Occupational History   Occupation: part-time, event planning  Tobacco Use   Smoking status: Never   Smokeless tobacco: Never  Vaping Use   Vaping Use: Never used  Substance and Sexual Activity   Alcohol use: Yes    Alcohol/week: 0.0 standard drinks of alcohol     Comment: rarely   Drug use: No   Sexual activity: Yes  Other Topics Concern   Not on file  Social History Narrative   Not on file   Social Determinants of Health   Financial Resource Strain: Not on file  Food Insecurity: Not on file  Transportation Needs: Not on file  Physical Activity: Not on file  Stress: Not on file  Social Connections: Not on file  Intimate Partner Violence: Not on file    Review of Systems  Constitutional:  Negative for chills, diaphoresis, malaise/fatigue and weight loss.  HENT: Negative.    Eyes: Negative.  Negative for blurred vision and double vision.  Cardiovascular:  Negative for chest pain.  Gastrointestinal:  Negative for abdominal pain.  Genitourinary: Negative.   Musculoskeletal:  Negative for falls and myalgias.  Neurological:  Negative for speech change, loss of consciousness and weakness.  Psychiatric/Behavioral: Negative.          Objective    BP (!) 150/98 (BP Location: Left Arm, Cuff Size: Large)   Pulse 77   Temp (!) 97.3 F (36.3 C) (Temporal)   Ht '4\' 11"'$  (1.499 m)   Wt 170 lb (77.1 kg)   SpO2 98%   BMI 34.34 kg/m   Physical Exam Constitutional:      General: She is not in acute distress.    Appearance: Normal appearance. She is not ill-appearing, toxic-appearing or diaphoretic.  HENT:     Head: Normocephalic and atraumatic.     Right Ear: External ear normal.     Left Ear: External ear normal.     Mouth/Throat:     Mouth: Mucous membranes are moist.     Pharynx: Oropharynx is clear. No oropharyngeal exudate or posterior oropharyngeal erythema.  Eyes:     General: No scleral icterus.       Right eye: No discharge.        Left eye: No discharge.     Extraocular Movements: Extraocular movements intact.     Conjunctiva/sclera: Conjunctivae normal.     Pupils: Pupils are equal, round, and reactive to light.  Cardiovascular:     Rate and Rhythm: Normal rate and regular rhythm.  Pulmonary:     Effort: Pulmonary  effort is normal. No respiratory distress.     Breath sounds: Normal breath sounds.  Abdominal:     General: Bowel sounds are normal.  Musculoskeletal:     Cervical back: No rigidity or tenderness.  Lymphadenopathy:     Cervical: No cervical adenopathy.  Skin:    General: Skin is warm and dry.  Neurological:     Mental Status: She is alert  and oriented to person, place, and time.  Psychiatric:        Mood and Affect: Mood normal.        Behavior: Behavior normal.         Assessment & Plan:   Problem List Items Addressed This Visit       Cardiovascular and Mediastinum   Primary hypertension   Relevant Medications   hydrochlorothiazide (HYDRODIURIL) 25 MG tablet   Other Relevant Orders   CBC   Comprehensive metabolic panel   Urinalysis, Routine w reflex microscopic     Other   Vitamin D deficiency   Relevant Orders   VITAMIN D 25 Hydroxy (Vit-D Deficiency, Fractures)   Hyperlipidemia   Relevant Medications   hydrochlorothiazide (HYDRODIURIL) 25 MG tablet   Other Relevant Orders   Lipid panel   Healthcare maintenance - Primary   Other fatigue   Relevant Orders   TSH   Stress due to illness of family member   Other Visit Diagnoses     Need for Tdap vaccination       Relevant Orders   Tdap vaccine greater than or equal to 7yo IM   Need for shingles vaccine       Relevant Orders   Varicella-zoster vaccine IM       Return in about 2 months (around 01/24/2022).  We will seek dental care.  Continue to exercise by walking.  We will start HCTZ for blood pressure control.  Warned about diuretic effect.  Information was given on managing hypertension.  Information also given on health maintenance and disease prevention.  Libby Maw, MD

## 2022-02-01 NOTE — Telephone Encounter (Signed)
Na

## 2022-02-05 ENCOUNTER — Ambulatory Visit: Admitting: Family Medicine

## 2022-02-05 ENCOUNTER — Encounter: Payer: Self-pay | Admitting: Family Medicine

## 2022-02-05 VITALS — BP 132/82 | HR 76 | Temp 97.5°F | Ht 59.0 in | Wt 172.8 lb

## 2022-02-05 DIAGNOSIS — Z23 Encounter for immunization: Secondary | ICD-10-CM | POA: Diagnosis not present

## 2022-02-05 DIAGNOSIS — I10 Essential (primary) hypertension: Secondary | ICD-10-CM

## 2022-02-05 DIAGNOSIS — E782 Mixed hyperlipidemia: Secondary | ICD-10-CM

## 2022-02-05 DIAGNOSIS — R7989 Other specified abnormal findings of blood chemistry: Secondary | ICD-10-CM | POA: Diagnosis not present

## 2022-02-05 LAB — BASIC METABOLIC PANEL
BUN: 16 mg/dL (ref 6–23)
CO2: 29 mEq/L (ref 19–32)
Calcium: 9.5 mg/dL (ref 8.4–10.5)
Chloride: 103 mEq/L (ref 96–112)
Creatinine, Ser: 0.78 mg/dL (ref 0.40–1.20)
GFR: 83.08 mL/min (ref >60.00)
Glucose, Bld: 95 mg/dL (ref 70–99)
Potassium: 3.6 mEq/L (ref 3.5–5.1)
Sodium: 140 mEq/L (ref 135–145)

## 2022-02-05 LAB — TSH: TSH: 2.48 u[IU]/mL (ref 0.35–5.50)

## 2022-02-05 LAB — LDL CHOLESTEROL, DIRECT: Direct LDL: 174 mg/dL

## 2022-02-05 MED ORDER — HYDROCHLOROTHIAZIDE 25 MG PO TABS: 25.0000 mg | ORAL_TABLET | Freq: Every day | ORAL | 3 refills | Status: DC

## 2022-02-05 NOTE — Progress Notes (Signed)
Established Patient Office Visit  Subjective   Patient ID: Victoria Bush, female    DOB: 07-24-1960  Age: 61 y.o. MRN: [id number]  Chief Complaint  Patient presents with   Follow-up    Routine follow up, discuss possibly coming off BP medications. Patient fasting.     HPI for follow-up today of hypertension and elevated ldl cholesterol.  Blood pressure well controlled with HCTZ.  She is mindful of salt in her diet.  Rarely eats out.  Has lowered the fat and cholesterol in her diet.  She is walking daily for up to an hour.  Her husband Awanda Mink is doing well at this point regarding his fight with gastric cancer.    Review of Systems  Constitutional: Negative.   HENT: Negative.    Eyes:  Negative for blurred vision, discharge and redness.  Respiratory: Negative.    Cardiovascular: Negative.   Gastrointestinal:  Negative for abdominal pain.  Genitourinary: Negative.   Musculoskeletal: Negative.  Negative for myalgias.  Skin:  Negative for rash.  Neurological:  Negative for tingling, loss of consciousness and weakness.  Endo/Heme/Allergies:  Negative for polydipsia.      Objective:     BP 132/82 (BP Location: Right Arm, Patient Position: Sitting, Cuff Size: Large)   Pulse 76   Temp (!) 97.5 F (36.4 C) (Temporal)   Ht '4\' 11"'$  (1.499 m)   Wt 172 lb 12.8 oz (78.4 kg)   SpO2 96%   BMI 34.90 kg/m  BP Readings from Last 3 Encounters:  02/05/22 132/82  11/24/21 (!) 150/98  08/24/21 (!) 151/84   Wt Readings from Last 3 Encounters:  02/05/22 172 lb 12.8 oz (78.4 kg)  11/24/21 170 lb (77.1 kg)  08/24/21 166 lb 9.6 oz (75.6 kg)      Physical Exam Constitutional:      General: She is not in acute distress.    Appearance: Normal appearance. She is not ill-appearing, toxic-appearing or diaphoretic.  HENT:     Head: Normocephalic and atraumatic.     Right Ear: External ear normal.     Left Ear: External ear normal.  Eyes:     General: No scleral icterus.       Right  eye: No discharge.        Left eye: No discharge.     Extraocular Movements: Extraocular movements intact.     Conjunctiva/sclera: Conjunctivae normal.  Pulmonary:     Effort: Pulmonary effort is normal. No respiratory distress.  Skin:    General: Skin is warm and dry.  Neurological:     Mental Status: She is alert and oriented to person, place, and time.  Psychiatric:        Mood and Affect: Mood normal.        Behavior: Behavior normal.      No results found for any visits on 02/05/22.    The 10-year ASCVD risk score (Arnett DK, et al., 2019) is: 9.1%    Assessment & Plan:   Problem List Items Addressed This Visit       Cardiovascular and Mediastinum   Primary hypertension   Relevant Medications   hydrochlorothiazide (HYDRODIURIL) 25 MG tablet   Other Relevant Orders   Basic metabolic panel     Other   Hyperlipidemia   Relevant Medications   hydrochlorothiazide (HYDRODIURIL) 25 MG tablet   Other Relevant Orders   LDL cholesterol, direct   Elevated TSH   Relevant Orders   TSH   Need for shingles  vaccine   Relevant Orders   Varicella-zoster vaccine IM (Completed)   Flu vaccine need - Primary   Relevant Orders   Flu Vaccine QUAD 6+ mos PF IM (Fluarix Quad PF) (Completed)    Return in about 6 months (around 08/06/2022).  Continue HCTZ.  We will make an effort to lose weight and continue exercising.  Information was given on managing hypertension and the Mediterranean diet to help lower cholesterol.  Second shingles vaccine and flu shot today.  Follow-up in 6 months.  Libby Maw, MD

## 2022-05-14 ENCOUNTER — Encounter (HOSPITAL_COMMUNITY): Payer: Self-pay | Admitting: *Deleted

## 2022-05-14 ENCOUNTER — Ambulatory Visit (HOSPITAL_COMMUNITY)
Admission: EM | Admit: 2022-05-14 | Discharge: 2022-05-14 | Disposition: A | Discharge status: Home / Self Care | Attending: Physician Assistant | Admitting: Physician Assistant

## 2022-05-14 DIAGNOSIS — J069 Acute upper respiratory infection, unspecified: Secondary | ICD-10-CM | POA: Diagnosis not present

## 2022-05-14 DIAGNOSIS — J40 Bronchitis, not specified as acute or chronic: Secondary | ICD-10-CM | POA: Diagnosis not present

## 2022-05-14 DIAGNOSIS — R051 Acute cough: Secondary | ICD-10-CM

## 2022-05-14 MED ORDER — PROMETHAZINE-DM 6.25-15 MG/5ML PO SYRP: 5.0000 mL | ORAL_SOLUTION | Freq: Four times a day (QID) | ORAL | 0 refills | Status: DC | PRN

## 2022-05-14 MED ORDER — PREDNISONE 10 MG PO TABS
10.0000 mg | ORAL_TABLET | Freq: Three times a day (TID) | ORAL | 0 refills | Status: DC
Start: 2022-05-14 — End: 2022-10-29

## 2022-05-14 NOTE — ED Triage Notes (Signed)
Pt states she has cough, nose bleed this morning, and she has bilateral ear pain she isn't able to sleep X 1.5 week. She has been taking alka seltzer, vick night and day without relief. Denies fever.

## 2022-05-14 NOTE — Discharge Instructions (Signed)
Advised to take, 1 teaspoon every 6-8 hours as needed for cough or congestion.  (Be cautious with this medication as it does cause drowsiness) Advised take prednisone 10 mg, every 8 hours until completed as this will help decrease the upper respiratory nasal and sinus congestion. Advised to follow-up with PCP or return to urgent care if symptoms fail to improve within the next 5 to 7 days.

## 2022-05-14 NOTE — ED Provider Notes (Signed)
[place]    CSN: [id number] Arrival date & time: 05/14/22  6283      History   Chief Complaint Chief Complaint  Patient presents with   Cough   Epistaxis   Otalgia    HPI Victoria Bush is a 61 y.o. female.   61 year old female presents with cough and congestion.  Patient indicates for the past 10 days she has been having persistent upper respiratory congestion with nasal drainage which is clear, and postnasal drip.  Patient indicates this morning she had a nosebleed from the right nostril which stopped after several minutes of pressure.  She indicates she has not had a nosebleed since.  Patient also indicates she is having a lot of bilateral ear pain and discomfort, postnasal drip and a mild sore throat which resolved over the past couple days.  Patient indicates she continues to have chest congestion and a cough mainly with clear production.  Patient has been taking some OTC cough preparations which tends to occasionally help.  She denies any fever or chills.  Patient indicates that she is not able to sleep at night due to going into coughing fits or coughing spasms.  She denies having shortness of breath.   Cough Associated symptoms: ear pain   Epistaxis Associated symptoms: cough   Otalgia Associated symptoms: cough     Past Medical History:  Diagnosis Date   Anemia    Migraine    no aura; lifetime history   Vaginal dysplasia    high grade   Wears glasses     Patient Active Problem List   Diagnosis Date Noted   Elevated TSH 02/05/2022   Need for shingles vaccine 02/05/2022   Flu vaccine need 02/05/2022   Primary hypertension 11/24/2021   Other fatigue 11/24/2021   Stress due to illness of family member 11/24/2021   VAIN (vaginal intraepithelial neoplasia) 06/12/2021   History of cervical dysplasia 06/12/2021   Vitamin D deficiency 08/05/2015   Hyperlipidemia 08/05/2015   Healthcare maintenance 08/05/2015   Allergic rhinitis 08/05/2015     Past Surgical History:  Procedure Laterality Date   ABDOMINAL HYSTERECTOMY  06/19/2012   Procedure: HYSTERECTOMY ABDOMINAL;  Surgeon: Allena Katz, MD;  Location: [facility];  Service: Gynecology;  Laterality: N/A;   breast cystectomy bilateral     yrs ago   CO2 LASER APPLICATION N/A 1/51/7616   Procedure: aborted CO2 laser application;  Surgeon: [name], MD;  Location: [facility];  Service: Gynecology;  Laterality: N/A;   CO2 LASER APPLICATION N/A 0/73/7106   Procedure: CO2 LASER OF VAGINA;  Surgeon: [name], MD;  Location: [facility];  Service: Gynecology;  Laterality: N/A;   colonscopy     few yrs ago   [place]     x2, laparotomy yrs ago   SALPINGOOPHORECTOMY  06/19/2012   Procedure: SALPINGO OOPHORECTOMY;  Surgeon: Shon Millet II, MD;  Location: [facility];  Service: Gynecology;  Laterality: Bilateral;   VAGINECTOMY, PARTIAL N/A 06/30/2021   Procedure: vaginectomy, partial aborted vaginal biopsy;  Surgeon: [name], MD;  Location: [facility];  Service: Gynecology;  Laterality: N/A;   WISDOM TOOTH EXTRACTION     yrs ago    OB History     Gravida  0   Para  0   Term  0   Preterm  0   AB  0   Living  0  SAB  0   IAB  0   Ectopic  0   Multiple  0   Live Births  0            Home Medications    Prior to Admission medications   Medication Sig Start Date End Date Taking? Authorizing Provider  b complex vitamins tablet Take 1 tablet by mouth daily.   Yes [provider]  BIOTIN FORTE PO Take by mouth daily.   Yes [provider]  cholecalciferol (VITAMIN D) 1000 units tablet Take 1,000 Units by mouth daily.   Yes [provider]  ferrous sulfate 325 (65 FE) MG tablet Take 325 mg by mouth daily with breakfast.   Yes [provider]  hydrochlorothiazide (HYDRODIURIL) 25 MG tablet Take 1 tablet (25 mg total) by  mouth daily. 02/05/22  Yes Libby Maw, MD  Omega-3 Fatty Acids (FISH OIL) 1200 MG CAPS Take 1,200 mg by mouth daily.   Yes [provider]  predniSONE (DELTASONE) 10 MG tablet Take 1 tablet (10 mg total) by mouth 3 (three) times daily. 05/14/22  Yes Nyoka Lint, PA-C  promethazine-dextromethorphan (PROMETHAZINE-DM) 6.25-15 MG/5ML syrup Take 5 mLs by mouth 4 (four) times daily as needed for cough. 05/14/22  Yes Nyoka Lint, PA-C  vitamin C (ASCORBIC ACID) 500 MG tablet Take 500 mg by mouth daily.   Yes [provider]  vitamin E 1000 UNIT capsule Take 1,000 Units by mouth daily.   Yes [provider]    Family History Family History  Problem Relation Age of Onset   Cancer Mother    Esophageal cancer Mother    Esophageal cancer Father    Mental illness Sister    Diabetes Sister    Colon cancer Neg Hx    Colon polyps Neg Hx    Breast cancer Neg Hx    Ovarian cancer Neg Hx    Endometrial cancer Neg Hx    Pancreatic cancer Neg Hx    Prostate cancer Neg Hx     Social History Social History   Tobacco Use   Smoking status: Never   Smokeless tobacco: Never  Vaping Use   Vaping Use: Never used  Substance Use Topics   Alcohol use: Yes    Alcohol/week: 0.0 standard drinks of alcohol    Comment: rarely   Drug use: No     Allergies   Patient has no known allergies.   Review of Systems Review of Systems  HENT:  Positive for ear pain and nosebleeds.   Respiratory:  Positive for cough.      Physical Exam Triage Vital Signs ED Triage Vitals  Enc Vitals Group     BP 05/14/22 0855 138/84     Pulse Rate 05/14/22 0855 81     Resp 05/14/22 0855 18     Temp 05/14/22 0855 97.9 F (36.6 C)     Temp Source 05/14/22 0855 Oral     SpO2 05/14/22 0855 97 %     Weight --      Height --      Head Circumference --      Peak Flow --      Pain Score 05/14/22 0853 6     Pain Loc --      Pain Edu? --      Excl. in [place]? --    No data  found.  Updated Vital Signs BP 138/84 (BP Location: Left Arm)   Pulse 81   Temp  97.9 F (36.6 C) (Oral)   Resp 18   SpO2 97%   Visual Acuity Right Eye Distance:   Left Eye Distance:   Bilateral Distance:    Right Eye Near:   Left Eye Near:    Bilateral Near:     Physical Exam Constitutional:      Appearance: Normal appearance.  HENT:     Right Ear: Ear canal normal. Tympanic membrane is injected.     Left Ear: Ear canal normal. Tympanic membrane is injected.     Mouth/Throat:     Mouth: Mucous membranes are moist.     Pharynx: Oropharynx is clear.  Cardiovascular:     Rate and Rhythm: Normal rate and regular rhythm.     Heart sounds: Normal heart sounds.  Pulmonary:     Effort: Pulmonary effort is normal.     Breath sounds: Normal breath sounds and air entry. No wheezing, rhonchi or rales.  Lymphadenopathy:     Cervical: No cervical adenopathy.  Neurological:     Mental Status: She is alert.      UC Treatments / Results  Labs (all labs ordered are listed, but only abnormal results are displayed) Labs Reviewed - No data to display  EKG   Radiology No results found.  Procedures Procedures (including critical care time)  Medications Ordered in UC Medications - No data to display  Initial Impression / Assessment and Plan / UC Course  I have reviewed the triage vital signs and the nursing notes.  Pertinent labs & imaging results that were available during my care of the patient were reviewed by me and considered in my medical decision making (see chart for details).    Plan: 1.  Upper respiratory tract infection be treated with the following: A.  Phenergan DM, every 6 hours to control cough and congestion. 2.  Acute cough will be treated with the following: A.  Phenergan DM every 6 hours to control cough and congestion. 3.  The Bronchitis will be treated with the following: A.  Phenergan DM every 6 hours to control cough congestion. B.  Prednisone 10  mg every 8 hours for 5 days only to treat the inflammatory response. 4.  Advised follow-up PCP or return to urgent care if symptoms develop or  Final Clinical Impressions(s) / UC Diagnoses   Final diagnoses:  Viral upper respiratory tract infection  Acute cough  Bronchitis     Discharge Instructions      Advised to take, 1 teaspoon every 6-8 hours as needed for cough or congestion.  (Be cautious with this medication as it does cause drowsiness) Advised take prednisone 10 mg, every 8 hours until completed as this will help decrease the upper respiratory nasal and sinus congestion. Advised to follow-up with PCP or return to urgent care if symptoms fail to improve within the next 5 to 7 days.    ED Prescriptions     Medication Sig Dispense Auth. Provider   promethazine-dextromethorphan (PROMETHAZINE-DM) 6.25-15 MG/5ML syrup Take 5 mLs by mouth 4 (four) times daily as needed for cough. 118 mL Nyoka Lint, PA-C   predniSONE (DELTASONE) 10 MG tablet Take 1 tablet (10 mg total) by mouth 3 (three) times daily. 15 tablet Nyoka Lint, PA-C      PDMP not reviewed this encounter.   Nyoka Lint, PA-C 05/14/22 [phone]

## 2022-05-19 ENCOUNTER — Ambulatory Visit
Admission: EM | Admit: 2022-05-19 | Discharge: 2022-05-19 | Disposition: A | Discharge status: Home / Self Care | Attending: Urgent Care | Admitting: Urgent Care

## 2022-05-19 ENCOUNTER — Ambulatory Visit (INDEPENDENT_AMBULATORY_CARE_PROVIDER_SITE_OTHER)

## 2022-05-19 DIAGNOSIS — J019 Acute sinusitis, unspecified: Secondary | ICD-10-CM | POA: Diagnosis not present

## 2022-05-19 DIAGNOSIS — R053 Chronic cough: Secondary | ICD-10-CM

## 2022-05-19 MED ORDER — PROMETHAZINE-DM 6.25-15 MG/5ML PO SYRP: 5.0000 mL | ORAL_SOLUTION | Freq: Four times a day (QID) | ORAL | 0 refills | Status: DC | PRN

## 2022-05-19 MED ORDER — BENZONATATE 100 MG PO CAPS: 100.0000 mg | ORAL_CAPSULE | Freq: Three times a day (TID) | ORAL | 0 refills | Status: DC | PRN

## 2022-05-19 MED ORDER — CETIRIZINE HCL 10 MG PO TABS: 10.0000 mg | ORAL_TABLET | Freq: Every day | ORAL | 0 refills | Status: DC

## 2022-05-19 MED ORDER — PSEUDOEPHEDRINE HCL 60 MG PO TABS: 60.0000 mg | ORAL_TABLET | Freq: Three times a day (TID) | ORAL | 0 refills | Status: DC | PRN

## 2022-05-19 MED ORDER — AMOXICILLIN 875 MG PO TABS: 875.0000 mg | ORAL_TABLET | Freq: Two times a day (BID) | ORAL | 0 refills | Status: DC

## 2022-05-19 NOTE — ED Triage Notes (Signed)
X1 week Cough, headache, and bilateral ear pain.  Pt states that she was seen at another urgent care 1 week ago and isn't any better.

## 2022-05-19 NOTE — ED Provider Notes (Addendum)
[facility]  Note:  This document was prepared using Systems analyst and may include unintentional dictation errors.  MRN: [id number] DOB: May 13, 1961  Subjective:   Victoria Bush is a 61 y.o. female presenting for 1 week history of persistent coughing, chest congestion, sinus headaches, bilateral ear pains, sinus congestion, drainage.  No overt chest pain, shortness of breath or wheezing.  Has a history of allergic rhinitis but does not take anything consistently for this.  No history of asthma.  Patient is not a smoker, no vaping, no marijuana use.  She was already seen through our clinic, underwent a prednisone course and supportive care.  States that she completed the entire course and feels like she is worse.  No current facility-administered medications for this encounter.  Current Outpatient Medications:    b complex vitamins tablet, Take 1 tablet by mouth daily., Disp: , Rfl:    BIOTIN FORTE PO, Take by mouth daily., Disp: , Rfl:    cholecalciferol (VITAMIN D) 1000 units tablet, Take 1,000 Units by mouth daily., Disp: , Rfl:    ferrous sulfate 325 (65 FE) MG tablet, Take 325 mg by mouth daily with breakfast., Disp: , Rfl:    hydrochlorothiazide (HYDRODIURIL) 25 MG tablet, Take 1 tablet (25 mg total) by mouth daily., Disp: 90 tablet, Rfl: 3   Omega-3 Fatty Acids (FISH OIL) 1200 MG CAPS, Take 1,200 mg by mouth daily., Disp: , Rfl:    predniSONE (DELTASONE) 10 MG tablet, Take 1 tablet (10 mg total) by mouth 3 (three) times daily., Disp: 15 tablet, Rfl: 0   promethazine-dextromethorphan (PROMETHAZINE-DM) 6.25-15 MG/5ML syrup, Take 5 mLs by mouth 4 (four) times daily as needed for cough., Disp: 118 mL, Rfl: 0   vitamin C (ASCORBIC ACID) 500 MG tablet, Take 500 mg by mouth daily., Disp: , Rfl:    vitamin E 1000 UNIT capsule, Take 1,000 Units by mouth daily., Disp: , Rfl:    No Known Allergies  Past Medical History:  Diagnosis Date   Anemia     Migraine    no aura; lifetime history   Vaginal dysplasia    high grade   Wears glasses      Past Surgical History:  Procedure Laterality Date   ABDOMINAL HYSTERECTOMY  06/19/2012   Procedure: HYSTERECTOMY ABDOMINAL;  Surgeon: Allena Katz, MD;  Location: [facility];  Service: Gynecology;  Laterality: N/A;   breast cystectomy bilateral     yrs ago   CO2 LASER APPLICATION N/A 1/95/0932   Procedure: aborted CO2 laser application;  Surgeon: [name], MD;  Location: [facility];  Service: Gynecology;  Laterality: N/A;   CO2 LASER APPLICATION N/A 6/71/2458   Procedure: CO2 LASER OF VAGINA;  Surgeon: [name], MD;  Location: [facility];  Service: Gynecology;  Laterality: N/A;   colonscopy     few yrs ago   [place]     x2, laparotomy yrs ago   SALPINGOOPHORECTOMY  06/19/2012   Procedure: SALPINGO OOPHORECTOMY;  Surgeon: Shon Millet II, MD;  Location: [facility];  Service: Gynecology;  Laterality: Bilateral;   VAGINECTOMY, PARTIAL N/A 06/30/2021   Procedure: vaginectomy, partial aborted vaginal biopsy;  Surgeon: [name], MD;  Location: [facility];  Service: Gynecology;  Laterality: N/A;   WISDOM TOOTH EXTRACTION     yrs ago    Family History  Problem Relation Age of Onset   Cancer Mother  Esophageal cancer Mother    Esophageal cancer Father    Mental illness Sister    Diabetes Sister    Colon cancer Neg Hx    Colon polyps Neg Hx    Breast cancer Neg Hx    Ovarian cancer Neg Hx    Endometrial cancer Neg Hx    Pancreatic cancer Neg Hx    Prostate cancer Neg Hx     Social History   Tobacco Use   Smoking status: Never   Smokeless tobacco: Never  Vaping Use   Vaping Use: Never used  Substance Use Topics   Alcohol use: Yes    Alcohol/week: 0.0 standard drinks of alcohol    Comment: rarely   Drug use: No    ROS   Objective:   Vitals: BP 137/82 (BP Location:  Left Arm)   Pulse 95   Temp 98.6 F (37 C) (Oral)   Resp 18   Ht '4\' 11"'$  (1.499 m)   Wt 165 lb (74.8 kg)   SpO2 97%   BMI 33.33 kg/m   Physical Exam Constitutional:      General: She is not in acute distress.    Appearance: Normal appearance. She is well-developed and normal weight. She is not ill-appearing, toxic-appearing or diaphoretic.  HENT:     Head: Normocephalic and atraumatic.     Right Ear: Tympanic membrane, ear canal and external ear normal. No drainage or tenderness. No middle ear effusion. There is no impacted cerumen. Tympanic membrane is not erythematous or bulging.     Left Ear: Tympanic membrane, ear canal and external ear normal. No drainage or tenderness.  No middle ear effusion. There is no impacted cerumen. Tympanic membrane is not erythematous or bulging.     Nose: Nose normal. No congestion or rhinorrhea.     Mouth/Throat:     Mouth: Mucous membranes are moist. No oral lesions.     Pharynx: No pharyngeal swelling, oropharyngeal exudate, posterior oropharyngeal erythema or uvula swelling.     Tonsils: No tonsillar exudate or tonsillar abscesses.  Eyes:     General: No scleral icterus.       Right eye: No discharge.        Left eye: No discharge.     Extraocular Movements: Extraocular movements intact.     Right eye: Normal extraocular motion.     Left eye: Normal extraocular motion.     Conjunctiva/sclera: Conjunctivae normal.  Cardiovascular:     Rate and Rhythm: Normal rate and regular rhythm.     Heart sounds: Normal heart sounds. No murmur heard.    No friction rub. No gallop.  Pulmonary:     Effort: Pulmonary effort is normal. No respiratory distress.     Breath sounds: No stridor. No wheezing, rhonchi or rales.  Chest:     Chest wall: No tenderness.  Musculoskeletal:     Cervical back: Normal range of motion and neck supple.  Lymphadenopathy:     Cervical: No cervical adenopathy.  Skin:    General: Skin is warm and dry.  Neurological:      General: No focal deficit present.     Mental Status: She is alert and oriented to person, place, and time.  Psychiatric:        Mood and Affect: Mood normal.        Behavior: Behavior normal.     DG Chest 2 View  Result Date: 05/19/2022 CLINICAL DATA:  Cough.  Ear pain. EXAM: CHEST - 2 VIEW  COMPARISON:  04/16/2016 FINDINGS: Midline trachea. Maintenance of vertebral body height and alignment. No pleural effusion or pneumothorax. Clear lungs. IMPRESSION: No active cardiopulmonary disease. Electronically Signed   By: Abigail Miyamoto M.D.   On: 05/19/2022 12:02     Assessment and Plan :   PDMP not reviewed this encounter.  1. Acute sinusitis, recurrence not specified, unspecified location   2. Persistent cough     Chest x-ray negative.  Will start empiric treatment for sinusitis with amoxicillin.  Recommended supportive care otherwise including the use of oral antihistamine, decongestant. Counseled patient on potential for adverse effects with medications prescribed/recommended today, ER and return-to-clinic precautions discussed, patient verbalized understanding.    Jaynee Eagles, PA-C 05/19/22 1248

## 2022-10-29 ENCOUNTER — Ambulatory Visit
Admission: EM | Admit: 2022-10-29 | Discharge: 2022-10-29 | Disposition: A | Discharge status: Home / Self Care | Attending: Urgent Care | Admitting: Urgent Care

## 2022-10-29 DIAGNOSIS — J329 Chronic sinusitis, unspecified: Secondary | ICD-10-CM

## 2022-10-29 MED ORDER — PSEUDOEPHEDRINE HCL 60 MG PO TABS: 60.0000 mg | ORAL_TABLET | Freq: Three times a day (TID) | ORAL | 0 refills | Status: DC | PRN

## 2022-10-29 MED ORDER — PROMETHAZINE-DM 6.25-15 MG/5ML PO SYRP: 5.0000 mL | ORAL_SOLUTION | Freq: Four times a day (QID) | ORAL | 0 refills | Status: DC | PRN

## 2022-10-29 MED ORDER — CETIRIZINE HCL 10 MG PO TABS: 10.0000 mg | ORAL_TABLET | Freq: Every day | ORAL | 0 refills | Status: AC

## 2022-10-29 MED ORDER — AMOXICILLIN 875 MG PO TABS: 875.0000 mg | ORAL_TABLET | Freq: Two times a day (BID) | ORAL | 0 refills | Status: DC

## 2022-10-29 NOTE — Discharge Instructions (Addendum)
We will manage this as a sinus infection with amoxicillin. For sore throat or cough try using a honey-based tea. Use 3 teaspoons of honey with juice squeezed from half lemon. Place shaved pieces of ginger into 1/2-1 cup of water and warm over stove top. Then mix the ingredients and repeat every 4 hours as needed. Please take ibuprofen 600mg every 6 hours with food alternating with OR taken together with Tylenol 500mg-650mg every 6 hours for throat pain, fevers, aches and pains. Hydrate very well with at least 2 liters of water. Eat light meals such as soups (chicken and noodles, vegetable, chicken and wild rice).  Do not eat foods that you are allergic to.  Taking an antihistamine like Zyrtec can help against postnasal drainage, sinus congestion which can cause sinus pain, sinus headaches, throat pain, painful swallowing, coughing.  You can take this together with pseudoephedrine (Sudafed) at a dose of 60 mg 3 times a day or twice daily as needed for the same kind of nasal drip, congestion.  Use cough medication as needed.   

## 2022-10-29 NOTE — ED Provider Notes (Signed)
[facility]  Note:  This document was prepared using Conservation officer, historic buildings and may include unintentional dictation errors.  MRN: [id number] DOB: March 18, 1961  Subjective:   Victoria Bush is a 62 y.o. female presenting for 11 day history of persistent yellow productive cough, bilateral ear pain and fullness, throat pain, drainage, malaise and fatigue.  No chest pain, shortness of breath or wheezing.  Patient has had sinus infections in the past, last one was at the end of the year of 2023.  Has not started using any of the medications previously recommended.  No smoking.  No asthma.  No current facility-administered medications for this encounter.  Current Outpatient Medications:    amoxicillin (AMOXIL) 875 MG tablet, Take 1 tablet (875 mg total) by mouth 2 (two) times daily., Disp: 14 tablet, Rfl: 0   b complex vitamins tablet, Take 1 tablet by mouth daily., Disp: , Rfl:    benzonatate (TESSALON) 100 MG capsule, Take 1 capsule (100 mg total) by mouth 3 (three) times daily as needed for cough., Disp: 30 capsule, Rfl: 0   BIOTIN FORTE PO, Take by mouth daily., Disp: , Rfl:    cetirizine (ZYRTEC ALLERGY) 10 MG tablet, Take 1 tablet (10 mg total) by mouth daily., Disp: 30 tablet, Rfl: 0   cholecalciferol (VITAMIN D) 1000 units tablet, Take 1,000 Units by mouth daily., Disp: , Rfl:    ferrous sulfate 325 (65 FE) MG tablet, Take 325 mg by mouth daily with breakfast., Disp: , Rfl:    hydrochlorothiazide (HYDRODIURIL) 25 MG tablet, Take 1 tablet (25 mg total) by mouth daily., Disp: 90 tablet, Rfl: 3   Omega-3 Fatty Acids (FISH OIL) 1200 MG CAPS, Take 1,200 mg by mouth daily., Disp: , Rfl:    predniSONE (DELTASONE) 10 MG tablet, Take 1 tablet (10 mg total) by mouth 3 (three) times daily., Disp: 15 tablet, Rfl: 0   promethazine-dextromethorphan (PROMETHAZINE-DM) 6.25-15 MG/5ML syrup, Take 5 mLs by mouth 4 (four) times daily as needed for cough., Disp: 200 mL, Rfl:  0   pseudoephedrine (SUDAFED) 60 MG tablet, Take 1 tablet (60 mg total) by mouth every 8 (eight) hours as needed for congestion., Disp: 30 tablet, Rfl: 0   vitamin C (ASCORBIC ACID) 500 MG tablet, Take 500 mg by mouth daily., Disp: , Rfl:    vitamin E 1000 UNIT capsule, Take 1,000 Units by mouth daily., Disp: , Rfl:    No Known Allergies  Past Medical History:  Diagnosis Date   Anemia    Migraine    no aura; lifetime history   Vaginal dysplasia    high grade   Wears glasses      Past Surgical History:  Procedure Laterality Date   ABDOMINAL HYSTERECTOMY  06/19/2012   Procedure: HYSTERECTOMY ABDOMINAL;  Surgeon: Leslie Andrea, MD;  Location: [facility];  Service: Gynecology;  Laterality: N/A;   breast cystectomy bilateral     yrs ago   CO2 LASER APPLICATION N/A 06/30/2021   Procedure: aborted CO2 laser application;  Surgeon: Carver Fila, MD;  Location: [facility];  Service: Gynecology;  Laterality: N/A;   CO2 LASER APPLICATION N/A 07/28/2021   Procedure: CO2 LASER OF VAGINA;  Surgeon: Carver Fila, MD;  Location: [facility];  Service: Gynecology;  Laterality: N/A;   colonscopy     few yrs ago   MYOMECTOMY ABDOMINAL APPROACH     x2, laparotomy yrs ago   SALPINGOOPHORECTOMY  06/19/2012  Procedure: SALPINGO OOPHORECTOMY;  Surgeon: Leslie Andrea, MD;  Location: [facility];  Service: Gynecology;  Laterality: Bilateral;   VAGINECTOMY, PARTIAL N/A 06/30/2021   Procedure: vaginectomy, partial aborted vaginal biopsy;  Surgeon: Carver Fila, MD;  Location: [facility];  Service: Gynecology;  Laterality: N/A;   WISDOM TOOTH EXTRACTION     yrs ago    Family History  Problem Relation Age of Onset   Cancer Mother    Esophageal cancer Mother    Esophageal cancer Father    Mental illness Sister    Diabetes Sister    Colon cancer Neg Hx    Colon polyps Neg Hx    Breast cancer Neg Hx    Ovarian cancer Neg Hx     Endometrial cancer Neg Hx    Pancreatic cancer Neg Hx    Prostate cancer Neg Hx     Social History   Tobacco Use   Smoking status: Never   Smokeless tobacco: Never  Vaping Use   Vaping Use: Never used  Substance Use Topics   Alcohol use: Yes    Alcohol/week: 0.0 standard drinks of alcohol    Comment: rarely   Drug use: No    ROS   Objective:   Vitals: BP 132/87 (BP Location: Right Arm)   Pulse 80   Temp 98.1 F (36.7 C) (Oral)   Resp 20   SpO2 97%   Physical Exam Constitutional:      General: She is not in acute distress.    Appearance: Normal appearance. She is well-developed and normal weight. She is not ill-appearing, toxic-appearing or diaphoretic.  HENT:     Head: Normocephalic and atraumatic.     Right Ear: Ear canal and external ear normal. No drainage or tenderness. No middle ear effusion. There is no impacted cerumen. Tympanic membrane is not erythematous or bulging.     Left Ear: Ear canal and external ear normal. No drainage or tenderness.  No middle ear effusion. There is no impacted cerumen. Tympanic membrane is not erythematous or bulging.     Ears:     Comments: Bilateral middle ear effusion.    Nose: Congestion present. No rhinorrhea.     Mouth/Throat:     Mouth: Mucous membranes are moist. No oral lesions.     Pharynx: Posterior oropharyngeal erythema (with significant post-nasal drainage overlying pharynx) present. No pharyngeal swelling, oropharyngeal exudate or uvula swelling.     Tonsils: No tonsillar exudate or tonsillar abscesses.  Eyes:     General: No scleral icterus.       Right eye: No discharge.        Left eye: No discharge.     Extraocular Movements: Extraocular movements intact.     Right eye: Normal extraocular motion.     Left eye: Normal extraocular motion.     Conjunctiva/sclera: Conjunctivae normal.  Cardiovascular:     Rate and Rhythm: Normal rate and regular rhythm.     Heart sounds: Normal heart sounds. No murmur heard.     No friction rub. No gallop.  Pulmonary:     Effort: Pulmonary effort is normal. No respiratory distress.     Breath sounds: No stridor. No wheezing, rhonchi or rales.  Chest:     Chest wall: No tenderness.  Musculoskeletal:     Cervical back: Normal range of motion and neck supple.  Lymphadenopathy:     Cervical: No cervical adenopathy.  Skin:    General: Skin is warm and  dry.  Neurological:     General: No focal deficit present.     Mental Status: She is alert and oriented to person, place, and time.  Psychiatric:        Mood and Affect: Mood normal.        Behavior: Behavior normal.    Assessment and Plan :   PDMP not reviewed this encounter.  1. Recurrent sinusitis    Deferred imaging given clear cardiopulmonary exam, hemodynamically stable vital signs. Will start empiric treatment for recurrent sinusitis with amoxicillin given timeline of her illness and overall symptom set.  Recommended supportive care otherwise. Counseled patient on potential for adverse effects with medications prescribed/recommended today, ER and return-to-clinic precautions discussed, patient verbalized understanding.    Wallis Bamberg, [place] 10/29/22 [phone]

## 2022-10-29 NOTE — ED Triage Notes (Signed)
Pt c/o prod cough, bilat ear pain x 1 week-NAD-steady gait

## 2022-11-02 IMAGING — MR MR PELVIS WO/W CM
19 of 20 series · 46 of 48 positions shown · IV contrast (gadavist)
Comparison: None.

CLINICAL DATA: Possible soft tissue abscess, purulent fluid from
vaginal cuff.

EXAM:
MRI PELVIS WITHOUT AND WITH CONTRAST
TECHNIQUE: Multiplanar multisequence MR imaging of the pelvis was performed
both before and after administration of intravenous contrast.
CONTRAST:  7.5mL GADAVIST GADOBUTROL 1 MMOL/ML IV SOLN

[Series 2: T2 · coronal · 6.0mm · 1.56mm/px · 2 of 30 slices shown (1 of 4)]
[im 1/30]
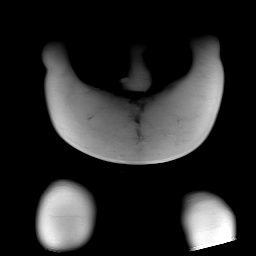
[im 30/30]
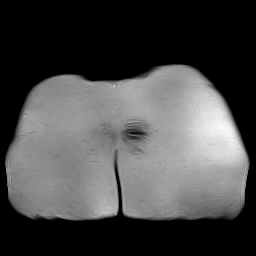

[Series 3: T2 · axial · 5.0mm · 0.51mm/px · 1 of 34 slices shown (2 of 4)]
[im 1/34]
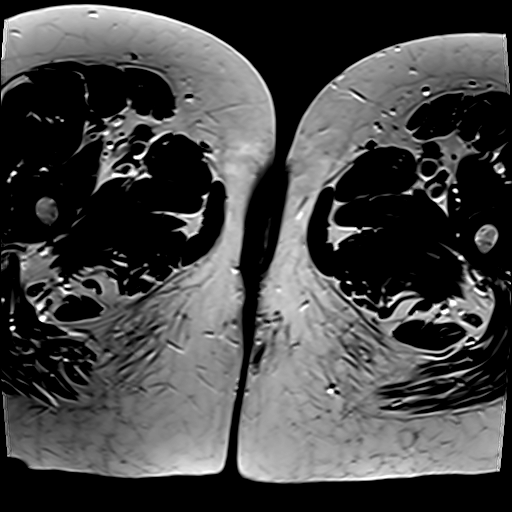

[Series 4: T2 · coronal · 4.0mm · 0.59mm/px · 2 of 40 slices shown (3 of 4)]
[im 1/40]
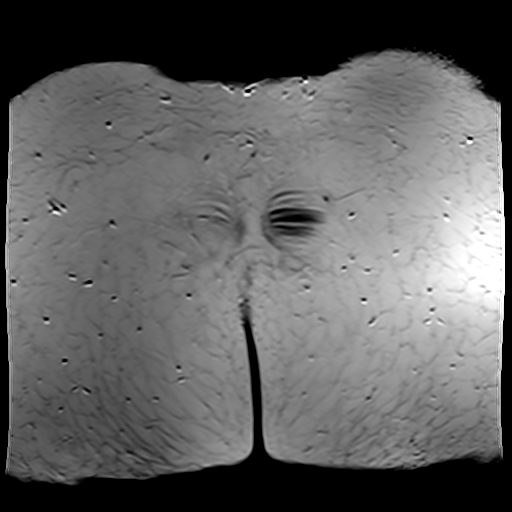
[im 40/40]
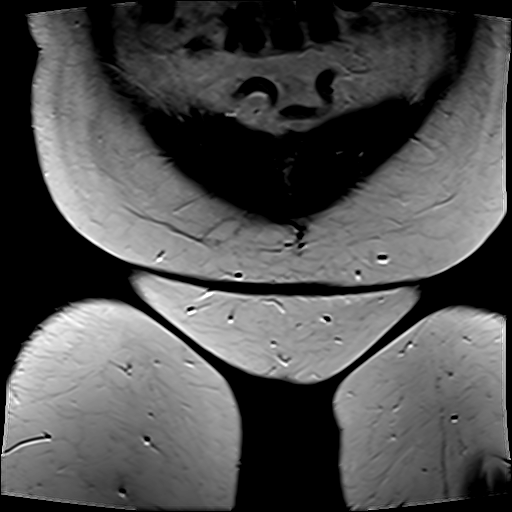

[Series 5: T2 fat-sat · axial · 5.0mm · 0.55mm/px · 1 of 34 slices shown]
[im 1/34]
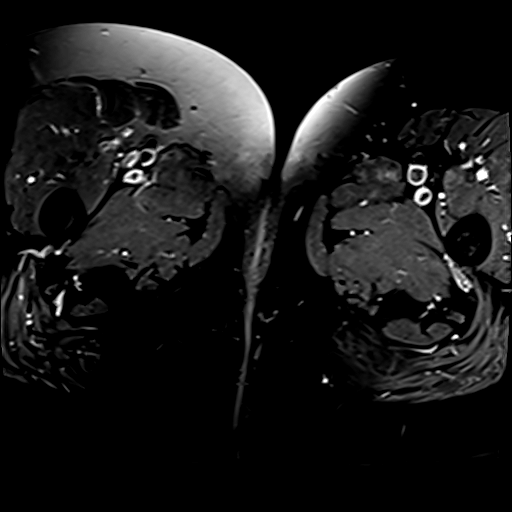

[Series 6: T2 · sagittal · 5.0mm · 0.59mm/px · 2 of 40 slices shown (4 of 4)]
[im 1/40]
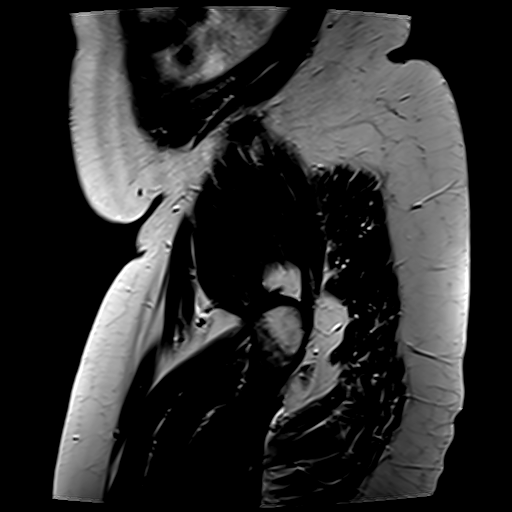
[im 40/40]
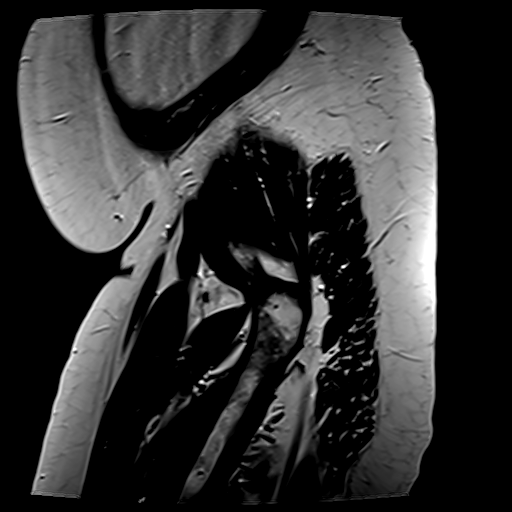

[Series 7: T1 · axial · 4.0mm · 0.84mm/px · z∈[-103,+181]mm · 3 of 72 slices shown (1 of 2)]
[im 1/72]
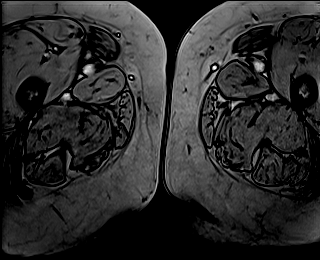
[im 36/72]
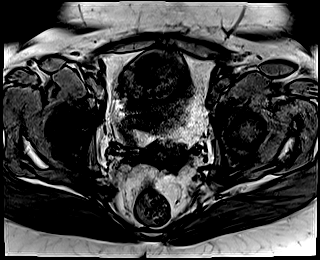
[im 72/72]
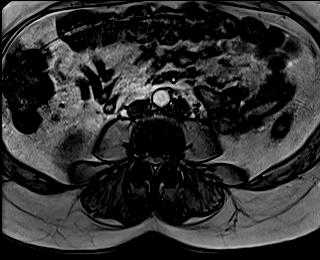

[Series 8: T1 · axial · 4.0mm · 0.84mm/px · z∈[-103,+181]mm · 3 of 72 slices shown (2 of 2)]
[im 1/72]
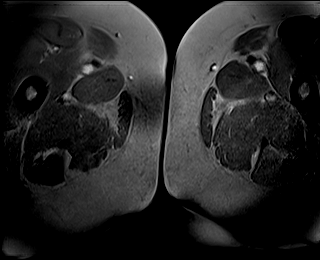
[im 36/72]
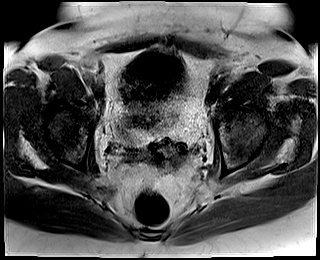
[im 72/72]
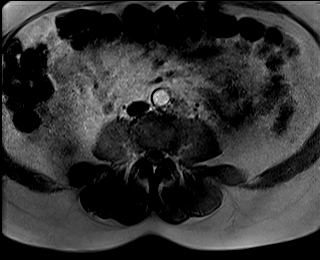

[Series 9: DWI · axial · 5.0mm · 2.80mm/px · z∈[-52,+93]mm · 4 of 89 slices shown (1 of 3)]
[im 1/89]
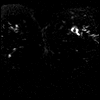
[im 30/89]
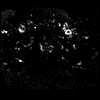
[im 59/89]
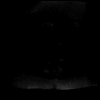
[im 89/89]
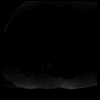

[Series 10: DWI · axial · 5.0mm · 2.80mm/px · 1 of 30 slices shown (2 of 3)]
[im 1/30]
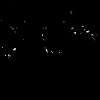

[Series 11: DWI · axial · 5.0mm · 2.80mm/px · 1 of 30 slices shown (3 of 3)]
[im 1/30]
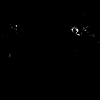

[Series 13: T1 dynamic · axial · 3.0mm · 0.84mm/px · z∈[-89,+148]mm · 3 of 80 slices shown (1 of 7)]
[im 1/80]
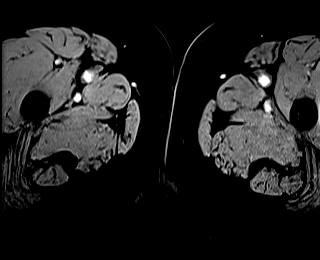
[im 40/80]
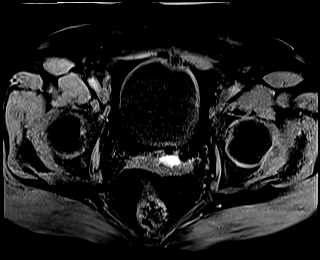
[im 80/80]
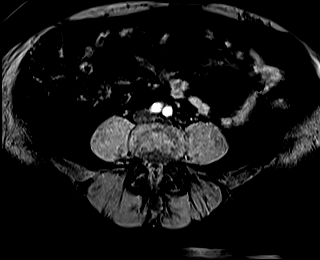

[Series 16: T1 dynamic · axial · 3.0mm · 0.84mm/px · z∈[-89,+148]mm · 3 of 80 slices shown (2 of 7)]
[im 1/80]
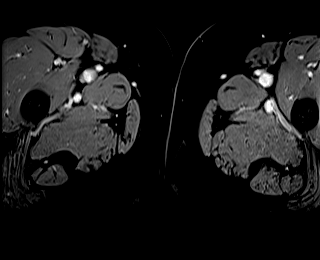
[im 40/80]
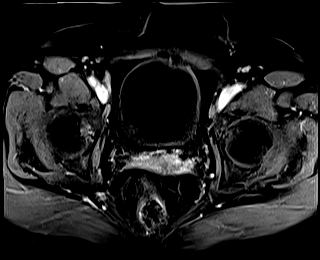
[im 80/80]
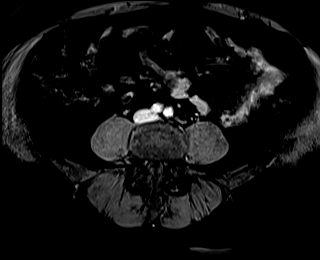

[Series 17: T1 dynamic · axial · 3.0mm · 0.84mm/px · z∈[-89,+148]mm · 3 of 80 slices shown (3 of 7)]
[im 1/80]
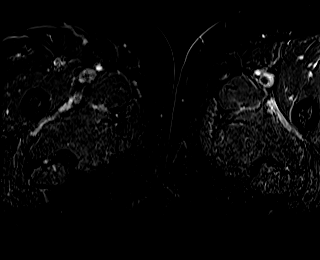
[im 40/80]
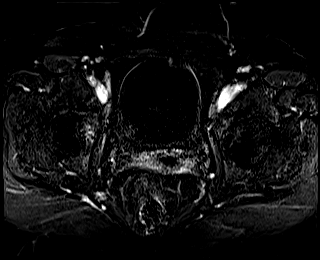
[im 80/80]
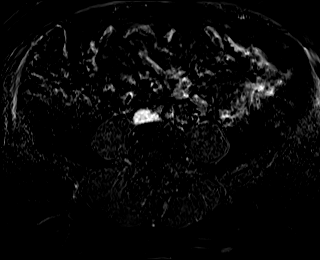

[Series 20: T1 dynamic · axial · 3.0mm · 0.84mm/px · z∈[-89,+148]mm · 3 of 80 slices shown (4 of 7)]
[im 1/80]
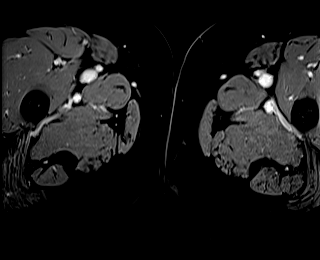
[im 40/80]
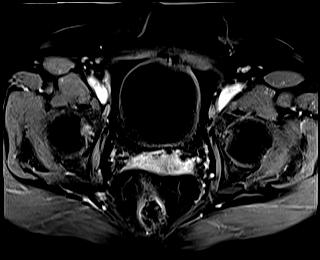
[im 80/80]
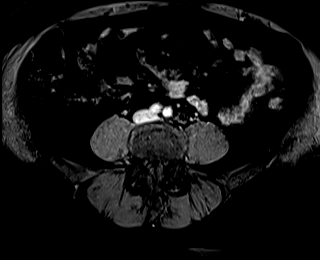

[Series 21: T1 dynamic · axial · 3.0mm · 0.84mm/px · z∈[-89,+148]mm · 3 of 80 slices shown (5 of 7)]
[im 1/80]
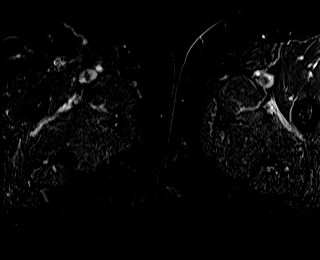
[im 40/80]
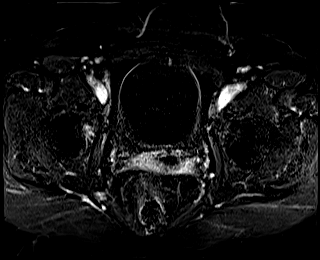
[im 80/80]
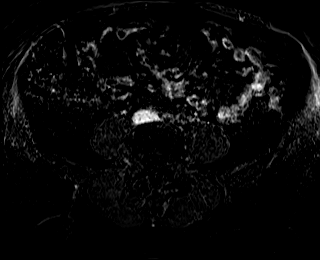

[Series 24: T1 dynamic · axial · 3.0mm · 0.84mm/px · z∈[-89,+148]mm · 3 of 80 slices shown (6 of 7)]
[im 1/80]
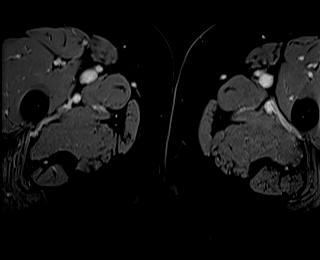
[im 40/80]
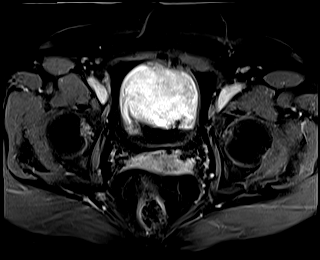
[im 80/80]
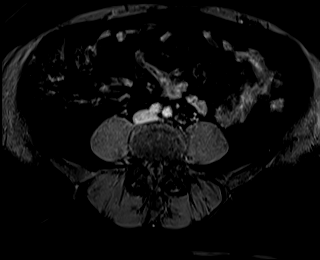

[Series 25: T1 dynamic · axial · 3.0mm · 0.84mm/px · z∈[-89,+148]mm · 3 of 80 slices shown (7 of 7)]
[im 1/80]
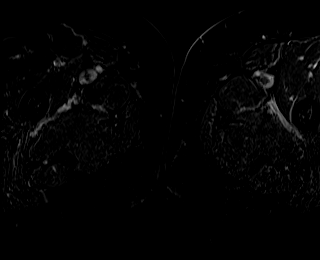
[im 40/80]
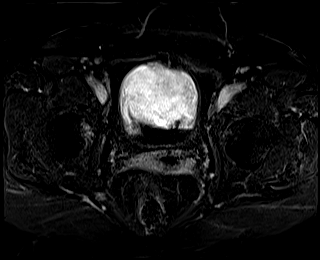
[im 80/80]
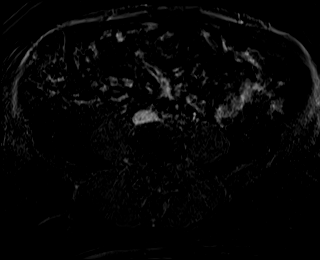

[Series 27: T1 dynamic post-contrast · axial · 3.0mm · 1.06mm/px · z∈[-93,+144]mm · 3 of 80 slices shown (1 of 2)]
[im 1/80]
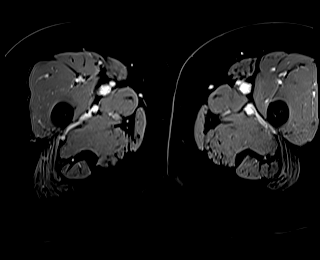
[im 40/80]
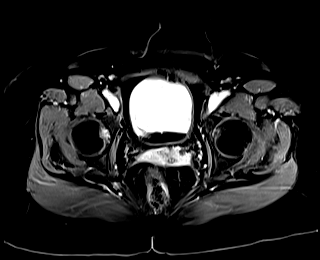
[im 80/80]
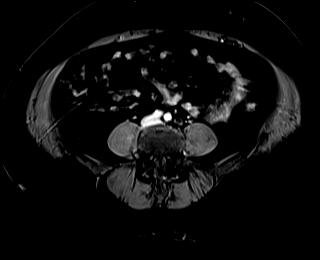

[Series 29: T1 dynamic post-contrast · sagittal · 3.0mm · 0.88mm/px · 2 of 52 slices shown (2 of 2)]
[im 1/52]
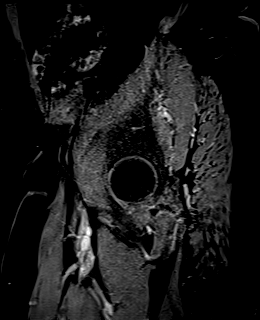
[im 52/52]
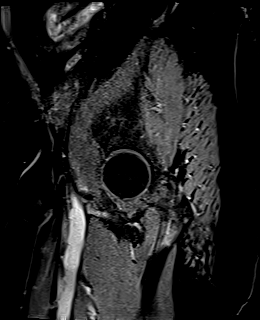

[46 of 48 positions shown; findings below may reference images not displayed]

FINDINGS: Urinary Tract:  No abnormality visualized.

Bowel:  Unremarkable visualized pelvic bowel loops.

Vascular/Lymphatic: No pathologically enlarged lymph nodes. No
significant vascular abnormality seen.

Reproductive: Status post hysterectomy. There is a small
intrinsically T1 hyperintense hemorrhagic or proteinaceous fluid
collection at the apex of the vaginal cuff, measuring 1.8 x 1.2 cm
(series 3, image 18, series 6, image 22). No associated contrast
enhancement.

Other:  None.

Musculoskeletal: No suspicious bone lesions identified.
IMPRESSION: 1. There is a 1.8 x 1.2 cm intrinsically T1 hyperintense hemorrhagic
or proteinaceous fluid collection at the apex of the vaginal cuff.
No associated contrast enhancement. Presence or absence of infection
is not established by MRI.
2. Status post hysterectomy.

## 2023-02-01 ENCOUNTER — Other Ambulatory Visit: Payer: Self-pay | Admitting: Family Medicine

## 2023-02-01 DIAGNOSIS — I10 Essential (primary) hypertension: Secondary | ICD-10-CM

## 2023-02-16 ENCOUNTER — Ambulatory Visit: Admitting: Family Medicine

## 2023-02-16 ENCOUNTER — Telehealth: Payer: Self-pay | Admitting: Family Medicine

## 2023-02-16 ENCOUNTER — Encounter: Payer: Self-pay | Admitting: Family Medicine

## 2023-02-16 VITALS — BP 138/88 | HR 75 | Temp 97.6°F | Ht 59.0 in | Wt 182.4 lb

## 2023-02-16 DIAGNOSIS — Z131 Encounter for screening for diabetes mellitus: Secondary | ICD-10-CM

## 2023-02-16 DIAGNOSIS — Z23 Encounter for immunization: Secondary | ICD-10-CM | POA: Diagnosis not present

## 2023-02-16 DIAGNOSIS — Z6838 Body mass index (BMI) 38.0-38.9, adult: Secondary | ICD-10-CM | POA: Insufficient documentation

## 2023-02-16 DIAGNOSIS — E782 Mixed hyperlipidemia: Secondary | ICD-10-CM | POA: Diagnosis not present

## 2023-02-16 DIAGNOSIS — I10 Essential (primary) hypertension: Secondary | ICD-10-CM | POA: Diagnosis not present

## 2023-02-16 DIAGNOSIS — R7989 Other specified abnormal findings of blood chemistry: Secondary | ICD-10-CM

## 2023-02-16 DIAGNOSIS — Z0000 Encounter for general adult medical examination without abnormal findings: Secondary | ICD-10-CM | POA: Diagnosis not present

## 2023-02-16 DIAGNOSIS — Z6379 Other stressful life events affecting family and household: Secondary | ICD-10-CM

## 2023-02-16 LAB — CBC
HCT: 42.2 % (ref 36.0–46.0)
Hemoglobin: 13.7 g/dL (ref 12.0–15.0)
MCHC: 32.5 g/dL (ref 30.0–36.0)
MCV: 80.9 fl (ref 78.0–100.0)
Platelets: 402 10*3/uL — ABNORMAL HIGH (ref 150.0–400.0)
RBC: 5.22 Mil/uL — ABNORMAL HIGH (ref 3.87–5.11)
RDW: 13.5 % (ref 11.5–15.5)
WBC: 7.5 10*3/uL (ref 4.0–10.5)

## 2023-02-16 LAB — LIPID PANEL
Cholesterol: 243 mg/dL — ABNORMAL HIGH (ref 0–200)
HDL: 42.4 mg/dL (ref >39.00)
LDL Cholesterol: 167 mg/dL — ABNORMAL HIGH (ref 0–99)
NonHDL: 201.06
Total CHOL/HDL Ratio: 6
Triglycerides: 170 mg/dL — ABNORMAL HIGH (ref 0.0–149.0)
VLDL: 34 mg/dL (ref 0.0–40.0)

## 2023-02-16 LAB — COMPREHENSIVE METABOLIC PANEL
ALT: 11 U/L (ref 0–35)
AST: 15 U/L (ref 0–37)
Albumin: 3.9 g/dL (ref 3.5–5.2)
Alkaline Phosphatase: 96 U/L (ref 39–117)
BUN: 8 mg/dL (ref 6–23)
CO2: 31 meq/L (ref 19–32)
Calcium: 9.5 mg/dL (ref 8.4–10.5)
Chloride: 99 meq/L (ref 96–112)
Creatinine, Ser: 0.82 mg/dL (ref 0.40–1.20)
GFR: 77.67 mL/min (ref >60.00)
Glucose, Bld: 94 mg/dL (ref 70–99)
Potassium: 3.6 meq/L (ref 3.5–5.1)
Sodium: 138 meq/L (ref 135–145)
Total Bilirubin: 0.6 mg/dL (ref 0.2–1.2)
Total Protein: 7.4 g/dL (ref 6.0–8.3)

## 2023-02-16 LAB — TSH: TSH: 4.58 u[IU]/mL (ref 0.35–5.50)

## 2023-02-16 LAB — HEMOGLOBIN A1C: Hgb A1c MFr Bld: 6.8 % — ABNORMAL HIGH (ref 4.6–6.5)

## 2023-02-16 NOTE — Telephone Encounter (Signed)
Victoria Bush [phone]   Pts insurance said they needed OV notes as to why she needs this referral and then they will cover.  [facility]

## 2023-02-16 NOTE — Progress Notes (Signed)
New Patient Office Visit  Subjective    Patient ID: Victoria Bush, female    DOB: 1960/06/21  Age: 62 y.o. MRN: [id number]  CC:  Chief Complaint  Patient presents with   Medical Management of Chronic Issues    Follo up. Pt requesting rx refill for hydrochlorothiazide. Pt requesting something to help with weight loss.     HPI Victoria Bush presents to establish care. Encounter Diagnoses  Name Primary?   Healthcare maintenance Yes   Primary hypertension    Mixed hyperlipidemia    Flu vaccine need    Stress due to illness of family member    Screening for diabetes mellitus    Elevated TSH    BMI 38.0-38.9,adult    For physical and follow-up of above.  Did not take her blood pressure medicine this morning.  She is walking for an hour 3 times weekly.  She is seeking dental care.  She lives at home with her husband and her sister.  Her husband Victoria Bush who is also my patient is struggling with esophageal cancer.  She has gained 10 pounds since last visit.  She is interested in losing weight.  She is only eating 1 meal daily.  Outpatient Encounter Medications as of 02/16/2023  Medication Sig   b complex vitamins tablet Take 1 tablet by mouth daily.   BIOTIN FORTE PO Take by mouth daily.   cetirizine (ZYRTEC ALLERGY) 10 MG tablet Take 1 tablet (10 mg total) by mouth daily.   cholecalciferol (VITAMIN D) 1000 units tablet Take 1,000 Units by mouth daily.   hydrochlorothiazide (HYDRODIURIL) 25 MG tablet Take 1 tablet (25 mg total) by mouth daily.   Omega-3 Fatty Acids (FISH OIL) 1200 MG CAPS Take 1,200 mg by mouth daily.   vitamin C (ASCORBIC ACID) 500 MG tablet Take 500 mg by mouth daily.   vitamin E 1000 UNIT capsule Take 1,000 Units by mouth daily.   amoxicillin (AMOXIL) 875 MG tablet Take 1 tablet (875 mg total) by mouth 2 (two) times daily. (Patient not taking: Reported on 02/16/2023)   ferrous sulfate 325 (65 FE) MG tablet Take 325 mg by mouth daily with breakfast. (Patient  not taking: Reported on 02/16/2023)   promethazine-dextromethorphan (PROMETHAZINE-DM) 6.25-15 MG/5ML syrup Take 5 mLs by mouth 4 (four) times daily as needed for cough. (Patient not taking: Reported on 02/16/2023)   pseudoephedrine (SUDAFED) 60 MG tablet Take 1 tablet (60 mg total) by mouth every 8 (eight) hours as needed for congestion. (Patient not taking: Reported on 02/16/2023)   No facility-administered encounter medications on file as of 02/16/2023.    Past Medical History:  Diagnosis Date   Anemia    Migraine    no aura; lifetime history   Vaginal dysplasia    high grade   Wears glasses     Past Surgical History:  Procedure Laterality Date   ABDOMINAL HYSTERECTOMY  06/19/2012   Procedure: HYSTERECTOMY ABDOMINAL;  Surgeon: Victoria Andrea, MD;  Location: [facility];  Service: Gynecology;  Laterality: N/A;   breast cystectomy bilateral     yrs ago   CO2 LASER APPLICATION N/A 06/30/2021   Procedure: aborted CO2 laser application;  Surgeon: Victoria Fila, MD;  Location: [facility];  Service: Gynecology;  Laterality: N/A;   CO2 LASER APPLICATION N/A 07/28/2021   Procedure: CO2 LASER OF VAGINA;  Surgeon: Victoria Fila, MD;  Location: [facility];  Service: Gynecology;  Laterality: N/A;   colonscopy  few yrs ago   MYOMECTOMY ABDOMINAL APPROACH     x2, laparotomy yrs ago   SALPINGOOPHORECTOMY  06/19/2012   Procedure: SALPINGO OOPHORECTOMY;  Surgeon: Victoria Locus II, MD;  Location: [facility];  Service: Gynecology;  Laterality: Bilateral;   VAGINECTOMY, PARTIAL N/A 06/30/2021   Procedure: vaginectomy, partial aborted vaginal biopsy;  Surgeon: Victoria Fila, MD;  Location: [facility];  Service: Gynecology;  Laterality: N/A;   WISDOM TOOTH EXTRACTION     yrs ago    Family History  Problem Relation Age of Onset   Cancer Mother    Esophageal cancer Mother    Esophageal cancer Father    Mental illness Sister    Diabetes  Sister    Colon cancer Neg Hx    Colon polyps Neg Hx    Breast cancer Neg Hx    Ovarian cancer Neg Hx    Endometrial cancer Neg Hx    Pancreatic cancer Neg Hx    Prostate cancer Neg Hx     Social History   Socioeconomic History   Marital status: Single    Spouse name: Not on file   Number of children: Not on file   Years of education: Not on file   Highest education level: 12th grade  Occupational History   Occupation: part-time, event planning  Tobacco Use   Smoking status: Never   Smokeless tobacco: Never  Vaping Use   Vaping status: Never Used  Substance and Sexual Activity   Alcohol use: Yes    Alcohol/week: 0.0 standard drinks of alcohol    Comment: rarely   Drug use: No   Sexual activity: Yes  Other Topics Concern   Not on file  Social History Narrative   Not on file   Social Determinants of Health   Financial Resource Strain: Not on file  Food Insecurity: No Food Insecurity (02/13/2023)   Hunger Vital Sign    Worried About Running Out of Food in the Last Year: Never true    Ran Out of Food in the Last Year: Never true  Transportation Needs: No Transportation Needs (02/13/2023)   PRAPARE - Administrator, Civil Service (Medical): No    Lack of Transportation (Non-Medical): No  Physical Activity: Insufficiently Active (02/13/2023)   Exercise Vital Sign    Days of Exercise per Week: 2 days    Minutes of Exercise per Session: 60 min  Stress: No Stress Concern Present (02/13/2023)   [employer] of Occupational Health - Occupational Stress Questionnaire    Feeling of Stress : Not at all  Social Connections: Moderately Isolated (02/13/2023)   Social Connection and Isolation Panel [NHANES]    Frequency of Communication with Friends and Family: Once a week    Frequency of Social Gatherings with Friends and Family: Once a week    Attends Religious Services: 1 to 4 times per year    Active Member of [employer] or Organizations: No    Attends Museum/gallery exhibitions officer: Not on file    Marital Status: Married  Intimate Partner Violence: Unknown (09/08/2021)   Received from [employer], [facility]   HITS    Physically Hurt: Not on file    Insult or Talk Down To: Not on file    Threaten Physical Harm: Not on file    Scream or Curse: Not on file    Review of Systems  Constitutional: Negative.   HENT: Negative.    Eyes:  Negative for blurred  vision, discharge and redness.  Respiratory: Negative.    Cardiovascular: Negative.   Gastrointestinal:  Negative for abdominal pain.  Genitourinary: Negative.   Musculoskeletal: Negative.  Negative for myalgias.  Skin:  Negative for rash.  Neurological:  Negative for tingling, loss of consciousness and weakness.  Endo/Heme/Allergies:  Negative for polydipsia.        Objective    BP 138/88   Pulse 75   Temp 97.6 F (36.4 C)   Ht 4\' 11"  (1.499 m)   Wt 182 lb 6.4 oz (82.7 kg)   SpO2 99%   BMI 36.84 kg/m   Physical Exam Constitutional:      General: She is not in acute distress.    Appearance: Normal appearance. She is not ill-appearing, toxic-appearing or diaphoretic.  HENT:     Head: Normocephalic and atraumatic.     Right Ear: Tympanic membrane, ear canal and external ear normal.     Left Ear: Tympanic membrane, ear canal and external ear normal.     Mouth/Throat:     Mouth: Mucous membranes are moist.     Pharynx: Oropharynx is clear. No oropharyngeal exudate or posterior oropharyngeal erythema.  Eyes:     General: No scleral icterus.       Right eye: No discharge.        Left eye: No discharge.     Extraocular Movements: Extraocular movements intact.     Conjunctiva/sclera: Conjunctivae normal.     Pupils: Pupils are equal, round, and reactive to light.  Cardiovascular:     Rate and Rhythm: Normal rate and regular rhythm.  Pulmonary:     Effort: Pulmonary effort is normal. No respiratory distress.     Breath sounds: Normal breath sounds. No wheezing, rhonchi or  rales.  Abdominal:     General: Bowel sounds are normal.  Musculoskeletal:     Cervical back: No rigidity or tenderness.  Skin:    General: Skin is warm and dry.  Neurological:     Mental Status: She is alert and oriented to person, place, and time.  Psychiatric:        Mood and Affect: Mood normal.        Behavior: Behavior normal.         Assessment & Plan:   Healthcare maintenance  Primary hypertension -     CBC -     Comprehensive metabolic panel -     Urinalysis, Routine w reflex microscopic  Mixed hyperlipidemia -     Comprehensive metabolic panel -     Lipid panel  Flu vaccine need -     Flu vaccine trivalent PF, 6mos and older(Flulaval,Afluria,Fluarix,Fluzone)  Stress due to illness of family member  Screening for diabetes mellitus -     Hemoglobin A1c  Elevated TSH -     TSH  BMI 38.0-38.9,adult -     Amb ref to Medical Nutrition Therapy-MNT     Return in about 3 months (around 05/18/2023).  Information was given on health maintenance and disease prevention as well as calorie counting to lose weight.  Agrees to see a nutritionist for counseling.  Please continue HCTZ for hypertension.  Please continue walking for exercise.  Please find a dentist for regular care. Mliss Sax, MD

## 2023-02-17 ENCOUNTER — Ambulatory Visit: Admitting: Family Medicine

## 2023-02-17 ENCOUNTER — Other Ambulatory Visit: Payer: Self-pay | Admitting: Family Medicine

## 2023-02-17 DIAGNOSIS — I10 Essential (primary) hypertension: Secondary | ICD-10-CM

## 2023-02-17 LAB — URINALYSIS, ROUTINE W REFLEX MICROSCOPIC
Bilirubin Urine: NEGATIVE
Hgb urine dipstick: NEGATIVE
Ketones, ur: NEGATIVE
Leukocytes,Ua: NEGATIVE
Nitrite: NEGATIVE
RBC / HPF: NONE SEEN (ref 0–?)
Specific Gravity, Urine: <=1.005 — AB (ref 1.000–1.030)
Total Protein, Urine: NEGATIVE
Urine Glucose: NEGATIVE
Urobilinogen, UA: 0.2 (ref 0.0–1.0)
pH: 7 (ref 5.0–8.0)

## 2023-02-17 NOTE — Telephone Encounter (Signed)
Will work on loading referral to [employer]. This process can take a few days to get approved.

## 2023-02-23 ENCOUNTER — Telehealth: Payer: Self-pay | Admitting: Family Medicine

## 2023-02-23 NOTE — Telephone Encounter (Signed)
Pam [phone] [facility]   They stated that this referral does not need a Z code. You don't have to send a new referral just change to E66.9 and they can proceed.   [facility]

## 2023-02-24 ENCOUNTER — Telehealth: Payer: Self-pay

## 2023-02-24 NOTE — Telephone Encounter (Signed)
Results and instructions given. Pt scheduled for OV to discuss with PCP.

## 2023-02-25 ENCOUNTER — Ambulatory Visit: Admitting: Family Medicine

## 2023-02-25 ENCOUNTER — Encounter: Payer: Self-pay | Admitting: Family Medicine

## 2023-02-25 VITALS — BP 126/76 | HR 78 | Temp 97.6°F | Ht 59.0 in | Wt 178.2 lb

## 2023-02-25 DIAGNOSIS — Z7984 Long term (current) use of oral hypoglycemic drugs: Secondary | ICD-10-CM | POA: Diagnosis not present

## 2023-02-25 DIAGNOSIS — E119 Type 2 diabetes mellitus without complications: Secondary | ICD-10-CM | POA: Diagnosis not present

## 2023-02-25 DIAGNOSIS — E7800 Pure hypercholesterolemia, unspecified: Secondary | ICD-10-CM

## 2023-02-25 MED ORDER — METFORMIN HCL ER 500 MG PO TB24: 500.0000 mg | ORAL_TABLET | Freq: Every evening | ORAL | 1 refills | Status: DC

## 2023-02-25 NOTE — Progress Notes (Signed)
Established Patient Office Visit   Subjective:  Patient ID: Victoria Bush, female    DOB: 1960-08-28  Age: 62 y.o. MRN: [id number]  Chief Complaint  Patient presents with   Medical Management of Chronic Issues    Discuss labs. A1c and Cholesterol    HPI Encounter Diagnoses  Name Primary?   Type 2 diabetes mellitus without complication, without long-term current use of insulin (HCC) Yes   Elevated LDL cholesterol level    For follow-up of the above.  Reluctant to start medications at this point and would like to lose weight first.  She is walking 3 to 5 miles several times weekly.  Denies any increase stress levels.  He is renting house along with 5 other people.  This seems to be working well for all involved.   Review of Systems  Constitutional: Negative.   HENT: Negative.    Eyes:  Negative for blurred vision, discharge and redness.  Respiratory: Negative.    Cardiovascular: Negative.   Gastrointestinal:  Negative for abdominal pain.  Genitourinary: Negative.   Musculoskeletal: Negative.  Negative for myalgias.  Skin:  Negative for rash.  Neurological:  Negative for tingling, loss of consciousness and weakness.  Endo/Heme/Allergies:  Negative for polydipsia.     Current Outpatient Medications:    b complex vitamins tablet, Take 1 tablet by mouth daily., Disp: , Rfl:    BIOTIN FORTE PO, Take by mouth daily., Disp: , Rfl:    cetirizine (ZYRTEC ALLERGY) 10 MG tablet, Take 1 tablet (10 mg total) by mouth daily., Disp: 30 tablet, Rfl: 0   cholecalciferol (VITAMIN D) 1000 units tablet, Take 1,000 Units by mouth daily., Disp: , Rfl:    hydrochlorothiazide (HYDRODIURIL) 25 MG tablet, TAKE ONE TABLET BY MOUTH ONE TIME DAILY, Disp: 90 tablet, Rfl: 0   metFORMIN (GLUCOPHAGE-XR) 500 MG 24 hr tablet, Take 1 tablet (500 mg total) by mouth at bedtime., Disp: 90 tablet, Rfl: 1   Omega-3 Fatty Acids (FISH OIL) 1200 MG CAPS, Take 1,200 mg by mouth daily., Disp: , Rfl:    vitamin C  (ASCORBIC ACID) 500 MG tablet, Take 500 mg by mouth daily., Disp: , Rfl:    vitamin E 1000 UNIT capsule, Take 1,000 Units by mouth daily., Disp: , Rfl:    Objective:     BP 126/76   Pulse 78   Temp 97.6 F (36.4 C)   Ht 4\' 11"  (1.499 m)   Wt 178 lb 3.2 oz (80.8 kg)   SpO2 97%   BMI 35.99 kg/m    Physical Exam Constitutional:      General: She is not in acute distress.    Appearance: Normal appearance. She is not ill-appearing, toxic-appearing or diaphoretic.  HENT:     Head: Normocephalic and atraumatic.     Right Ear: External ear normal.     Left Ear: External ear normal.  Eyes:     General: No scleral icterus.       Right eye: No discharge.        Left eye: No discharge.     Extraocular Movements: Extraocular movements intact.     Conjunctiva/sclera: Conjunctivae normal.  Pulmonary:     Effort: Pulmonary effort is normal. No respiratory distress.  Skin:    General: Skin is warm and dry.  Neurological:     Mental Status: She is alert and oriented to person, place, and time.  Psychiatric:        Mood and Affect: Mood normal.  Behavior: Behavior normal.      No results found for any visits on 02/25/23.    The 10-year ASCVD risk score (Arnett DK, et al., 2019) is: 20.3%    Assessment & Plan:   Type 2 diabetes mellitus without complication, without long-term current use of insulin (HCC) -     metFORMIN HCl ER; Take 1 tablet (500 mg total) by mouth at bedtime.  Dispense: 90 tablet; Refill: 1  Elevated LDL cholesterol level    Return Has follow-up appointment in December.Almira Coaster to start metformin at at bedtime.  We discussed the side effects of intestinal cramping and loose stools.  Advised that for most people this passes in a few weeks.  She will let me know if it does not.  Information was given on type 2 diabetes.  Would like to hold off on treating cholesterol for now.  She is determined to lose weight.  Was given information on the Mediterranean  diet is an example of a low-fat low-cholesterol diet.  Believe that we may be able to drop the diagnosis to prediabetes on her return.  Mliss Sax, MD

## 2023-02-28 NOTE — Telephone Encounter (Signed)
PER [facility]   Auth Not Required This patient is not enrolled in [facility] so no referral is required for the requested services.

## 2023-04-09 LAB — HM MAMMOGRAPHY

## 2023-04-13 ENCOUNTER — Encounter: Payer: Self-pay | Admitting: Family Medicine

## 2023-05-20 ENCOUNTER — Ambulatory Visit: Admitting: Family Medicine

## 2023-05-30 ENCOUNTER — Encounter: Payer: Self-pay | Admitting: Family Medicine

## 2023-05-30 ENCOUNTER — Ambulatory Visit: Admitting: Family Medicine

## 2023-05-30 VITALS — BP 118/78 | HR 77 | Temp 98.2°F | Ht 59.0 in | Wt 160.8 lb

## 2023-05-30 DIAGNOSIS — E7800 Pure hypercholesterolemia, unspecified: Secondary | ICD-10-CM

## 2023-05-30 DIAGNOSIS — E876 Hypokalemia: Secondary | ICD-10-CM

## 2023-05-30 DIAGNOSIS — R7303 Prediabetes: Secondary | ICD-10-CM

## 2023-05-30 DIAGNOSIS — I10 Essential (primary) hypertension: Secondary | ICD-10-CM

## 2023-05-30 LAB — BASIC METABOLIC PANEL
BUN: 8 mg/dL (ref 6–23)
CO2: 29 meq/L (ref 19–32)
Calcium: 9.6 mg/dL (ref 8.4–10.5)
Chloride: 103 meq/L (ref 96–112)
Creatinine, Ser: 0.79 mg/dL (ref 0.40–1.20)
GFR: 81.07 mL/min (ref >60.00)
Glucose, Bld: 99 mg/dL (ref 70–99)
Potassium: 3.3 meq/L — ABNORMAL LOW (ref 3.5–5.1)
Sodium: 140 meq/L (ref 135–145)

## 2023-05-30 LAB — LIPID PANEL
Cholesterol: 233 mg/dL — ABNORMAL HIGH (ref 0–200)
HDL: 42.2 mg/dL (ref >39.00)
LDL Cholesterol: 162 mg/dL — ABNORMAL HIGH (ref 0–99)
NonHDL: 190.67
Total CHOL/HDL Ratio: 6
Triglycerides: 141 mg/dL (ref 0.0–149.0)
VLDL: 28.2 mg/dL (ref 0.0–40.0)

## 2023-05-30 LAB — HEMOGLOBIN A1C: Hgb A1c MFr Bld: 6.5 % (ref 4.6–6.5)

## 2023-05-30 MED ORDER — HYDROCHLOROTHIAZIDE 25 MG PO TABS: 25.0000 mg | ORAL_TABLET | Freq: Every day | ORAL | 0 refills | Status: DC

## 2023-05-30 MED ORDER — POTASSIUM CHLORIDE CRYS ER 10 MEQ PO TBCR: 10.0000 meq | EXTENDED_RELEASE_TABLET | Freq: Two times a day (BID) | ORAL | 5 refills | Status: DC

## 2023-05-30 NOTE — Addendum Note (Signed)
Addended by: Andrez Grime on: 05/30/2023 02:04 PM   Modules accepted: Orders

## 2023-05-30 NOTE — Progress Notes (Addendum)
Established Patient Office Visit   Subjective:  Patient ID: Victoria Bush, female    DOB: [birth year]  Age: 62 y.o. MRN: [id number]  Chief Complaint  Patient presents with   Medical Management of Chronic Issues    3 month follow up. Pt is down 18 pounds since last visit.     HPI Encounter Diagnoses  Name Primary?   Elevated LDL cholesterol level Yes   Prediabetes    Primary hypertension    Essential hypertension    Hypokalemia    For follow-up of above.  Continues with positive lifestyle changes including a 22 pound weight loss since September.  She continues exercising by walking almost daily for 30 to 60 minutes.  She has adjusted her diet as well.  Continues to support her husband who is also my patient suffering from gastric cancer.   Review of Systems  Constitutional: Negative.   HENT: Negative.    Eyes:  Negative for blurred vision, discharge and redness.  Respiratory: Negative.    Cardiovascular: Negative.   Gastrointestinal:  Negative for abdominal pain.  Genitourinary: Negative.   Musculoskeletal: Negative.  Negative for myalgias.  Skin:  Negative for rash.  Neurological:  Negative for tingling, loss of consciousness and weakness.  Endo/Heme/Allergies:  Negative for polydipsia.     Current Outpatient Medications:    b complex vitamins tablet, Take 1 tablet by mouth daily., Disp: , Rfl:    BIOTIN FORTE PO, Take by mouth daily., Disp: , Rfl:    cetirizine (ZYRTEC ALLERGY) 10 MG tablet, Take 1 tablet (10 mg total) by mouth daily., Disp: 30 tablet, Rfl: 0   cholecalciferol (VITAMIN D) 1000 units tablet, Take 1,000 Units by mouth daily., Disp: , Rfl:    Omega-3 Fatty Acids (FISH OIL) 1200 MG CAPS, Take 1,200 mg by mouth daily., Disp: , Rfl:    potassium chloride (KLOR-CON M) 10 MEQ tablet, Take 1 tablet (10 mEq total) by mouth 2 (two) times daily., Disp: 60 tablet, Rfl: 5   vitamin C (ASCORBIC ACID) 500 MG tablet, Take 500 mg by mouth daily., Disp: , Rfl:     vitamin E 1000 UNIT capsule, Take 1,000 Units by mouth daily., Disp: , Rfl:    hydrochlorothiazide (HYDRODIURIL) 25 MG tablet, Take 1 tablet (25 mg total) by mouth daily., Disp: 90 tablet, Rfl: 0   Objective:     BP 118/78   Pulse 77   Temp 98.2 F (36.8 C)   Ht 4\' 11"  (1.499 m)   Wt 160 lb 12.8 oz (72.9 kg)   SpO2 98%   BMI 32.48 kg/m  Wt Readings from Last 3 Encounters:  05/30/23 160 lb 12.8 oz (72.9 kg)  02/25/23 178 lb 3.2 oz (80.8 kg)  02/16/23 182 lb 6.4 oz (82.7 kg)      Physical Exam Constitutional:      General: She is not in acute distress.    Appearance: Normal appearance. She is not ill-appearing, toxic-appearing or diaphoretic.  HENT:     Head: Normocephalic and atraumatic.     Right Ear: External ear normal.     Left Ear: External ear normal.     Mouth/Throat:     Mouth: Mucous membranes are moist.     Pharynx: Oropharynx is clear. No oropharyngeal exudate or posterior oropharyngeal erythema.  Eyes:     General: No scleral icterus.       Right eye: No discharge.        Left eye: No discharge.  Extraocular Movements: Extraocular movements intact.     Conjunctiva/sclera: Conjunctivae normal.  Cardiovascular:     Rate and Rhythm: Normal rate and regular rhythm.  Pulmonary:     Effort: Pulmonary effort is normal. No respiratory distress.     Breath sounds: Normal breath sounds.  Musculoskeletal:     Cervical back: No rigidity or tenderness.  Skin:    General: Skin is warm and dry.  Neurological:     Mental Status: She is alert and oriented to person, place, and time.  Psychiatric:        Mood and Affect: Mood normal.        Behavior: Behavior normal.      Results for orders placed or performed in visit on 05/30/23  Hemoglobin A1c  Result Value Ref Range   Hgb A1c MFr Bld 6.5 4.6 - 6.5 %  Lipid panel  Result Value Ref Range   Cholesterol 233 (H) 0 - 200 mg/dL   Triglycerides 841.3 0.0 - 149.0 mg/dL   HDL 24.40 >10.27 mg/dL   VLDL 25.3 0.0  - 66.4 mg/dL   LDL Cholesterol 403 (H) 0 - 99 mg/dL   Total CHOL/HDL Ratio 6    NonHDL 190.67   Basic metabolic panel  Result Value Ref Range   Sodium 140 135 - 145 mEq/L   Potassium 3.3 (L) 3.5 - 5.1 mEq/L   Chloride 103 96 - 112 mEq/L   CO2 29 19 - 32 mEq/L   Glucose, Bld 99 70 - 99 mg/dL   BUN 8 6 - 23 mg/dL   Creatinine, Ser 4.74 0.40 - 1.20 mg/dL   GFR 25.95 >63.87 mL/min   Calcium 9.6 8.4 - 10.5 mg/dL      The 56-EPPI ASCVD risk score (Arnett DK, et al., 2019) is: 7.2%    Assessment & Plan:   Elevated LDL cholesterol level -     Lipid panel  Prediabetes -     Hemoglobin A1c -     Basic metabolic panel  Primary hypertension -     hydroCHLOROthiazide; Take 1 tablet (25 mg total) by mouth daily.  Dispense: 90 tablet; Refill: 0  Essential hypertension -     Potassium Chloride Crys ER; Take 1 tablet (10 mEq total) by mouth 2 (two) times daily.  Dispense: 60 tablet; Refill: 5  Hypokalemia -     Potassium Chloride Crys ER; Take 1 tablet (10 mEq total) by mouth 2 (two) times daily.  Dispense: 60 tablet; Refill: 5    Return in about 6 months (around 11/28/2023).    Mliss Sax, MD

## 2023-06-16 ENCOUNTER — Encounter: Payer: Self-pay | Admitting: Family Medicine

## 2023-06-16 ENCOUNTER — Ambulatory Visit: Admitting: Family Medicine

## 2023-06-16 VITALS — BP 118/84 | HR 63 | Temp 98.0°F | Resp 16 | Ht 59.0 in | Wt 161.6 lb

## 2023-06-16 DIAGNOSIS — E7800 Pure hypercholesterolemia, unspecified: Secondary | ICD-10-CM | POA: Diagnosis not present

## 2023-06-16 DIAGNOSIS — R7303 Prediabetes: Secondary | ICD-10-CM | POA: Diagnosis not present

## 2023-06-16 DIAGNOSIS — E876 Hypokalemia: Secondary | ICD-10-CM

## 2023-06-16 DIAGNOSIS — I10 Essential (primary) hypertension: Secondary | ICD-10-CM | POA: Diagnosis not present

## 2023-06-16 LAB — BASIC METABOLIC PANEL
BUN: 10 mg/dL (ref 6–23)
CO2: 29 meq/L (ref 19–32)
Calcium: 9.4 mg/dL (ref 8.4–10.5)
Chloride: 101 meq/L (ref 96–112)
Creatinine, Ser: 0.67 mg/dL (ref 0.40–1.20)
GFR: 94.69 mL/min (ref >60.00)
Glucose, Bld: 87 mg/dL (ref 70–99)
Potassium: 3.6 meq/L (ref 3.5–5.1)
Sodium: 138 meq/L (ref 135–145)

## 2023-06-16 MED ORDER — ATORVASTATIN CALCIUM 20 MG PO TABS: 20.0000 mg | ORAL_TABLET | Freq: Every day | ORAL | 3 refills | Status: DC

## 2023-06-16 NOTE — Progress Notes (Signed)
 Established Patient Office Visit   Subjective:  Patient ID: Victoria Bush, female    DOB: 04-19-1961  Age: 63 y.o. MRN: [id number]  Chief Complaint  Patient presents with   Hyperlipidemia    Here for follow up    HPI Encounter Diagnoses  Name Primary?   Prediabetes Yes   Elevated LDL cholesterol level    Primary hypertension    Hypokalemia    Essential hypertension    For follow-up of elevated ldl cholesterol with increased risk for vascular disease.   Review of Systems  Constitutional: Negative.   HENT: Negative.    Eyes:  Negative for blurred vision, discharge and redness.  Respiratory: Negative.    Cardiovascular: Negative.   Gastrointestinal:  Negative for abdominal pain.  Genitourinary: Negative.   Musculoskeletal: Negative.  Negative for myalgias.  Skin:  Negative for rash.  Neurological:  Negative for tingling, loss of consciousness and weakness.  Endo/Heme/Allergies:  Negative for polydipsia.     Current Outpatient Medications:    atorvastatin  (LIPITOR) 20 MG tablet, Take 1 tablet (20 mg total) by mouth daily., Disp: 90 tablet, Rfl: 3   b complex vitamins tablet, Take 1 tablet by mouth daily., Disp: , Rfl:    BIOTIN FORTE PO, Take by mouth daily., Disp: , Rfl:    cetirizine  (ZYRTEC  ALLERGY) 10 MG tablet, Take 1 tablet (10 mg total) by mouth daily., Disp: 30 tablet, Rfl: 0   cholecalciferol (VITAMIN D ) 1000 units tablet, Take 1,000 Units by mouth daily., Disp: , Rfl:    hydrochlorothiazide  (HYDRODIURIL ) 25 MG tablet, Take 1 tablet (25 mg total) by mouth daily., Disp: 90 tablet, Rfl: 0   Omega-3 Fatty Acids (FISH OIL) 1200 MG CAPS, Take 1,200 mg by mouth daily., Disp: , Rfl:    potassium chloride  (KLOR-CON  M) 10 MEQ tablet, Take 1 tablet (10 mEq total) by mouth 2 (two) times daily., Disp: 60 tablet, Rfl: 5   vitamin C (ASCORBIC ACID) 500 MG tablet, Take 500 mg by mouth daily., Disp: , Rfl:    vitamin E 1000 UNIT capsule, Take 1,000 Units by mouth daily.,  Disp: , Rfl:    Objective:     BP 118/84 (BP Location: Right Arm, Patient Position: Sitting, Cuff Size: Normal)   Pulse 63   Temp 98 F (36.7 C) (Temporal)   Resp 16   Ht 4' 11 (1.499 m)   Wt 161 lb 9.6 oz (73.3 kg)   SpO2 99%   BMI 32.64 kg/m    Physical Exam Constitutional:      General: She is not in acute distress.    Appearance: Normal appearance. She is not ill-appearing, toxic-appearing or diaphoretic.  HENT:     Head: Normocephalic and atraumatic.     Right Ear: External ear normal.     Left Ear: External ear normal.  Eyes:     General: No scleral icterus.       Right eye: No discharge.        Left eye: No discharge.     Extraocular Movements: Extraocular movements intact.     Conjunctiva/sclera: Conjunctivae normal.  Pulmonary:     Effort: Pulmonary effort is normal. No respiratory distress.  Skin:    General: Skin is warm and dry.  Neurological:     Mental Status: She is alert and oriented to person, place, and time.  Psychiatric:        Mood and Affect: Mood normal.        Behavior: Behavior normal.  No results found for any visits on 06/16/23.    The 10-year ASCVD risk score (Arnett DK, et al., 2019) is: 7.2%    Assessment & Plan:   Prediabetes  Elevated LDL cholesterol level -     Atorvastatin  Calcium ; Take 1 tablet (20 mg total) by mouth daily.  Dispense: 90 tablet; Refill: 3  Primary hypertension  Hypokalemia -     Basic metabolic panel  Essential hypertension    Return in about 6 months (around 12/14/2023).  Continue potassium.  Will start atorvastatin  20.  Asked her to look out for any unusual muscle aches and let me know.  She understands the increased risk for vascular disease with her history of prediabetes and hypertension.  With her request, will continue weight loss efforts and we will hold off on treating prediabetes at this point.  Elsie Sim Lent, MD

## 2023-07-02 ENCOUNTER — Other Ambulatory Visit: Payer: Self-pay

## 2023-07-02 ENCOUNTER — Ambulatory Visit
Admission: EM | Admit: 2023-07-02 | Discharge: 2023-07-02 | Disposition: A | Discharge status: Home / Self Care | Attending: Family Medicine | Admitting: Family Medicine

## 2023-07-02 DIAGNOSIS — B349 Viral infection, unspecified: Secondary | ICD-10-CM

## 2023-07-02 LAB — POC COVID19/FLU A&B COMBO
Covid Antigen, POC: NEGATIVE
Influenza A Antigen, POC: NEGATIVE
Influenza B Antigen, POC: NEGATIVE

## 2023-07-02 MED ORDER — PROMETHAZINE-DM 6.25-15 MG/5ML PO SYRP: 5.0000 mL | ORAL_SOLUTION | Freq: Four times a day (QID) | ORAL | 0 refills | Status: DC | PRN

## 2023-07-02 NOTE — ED Triage Notes (Signed)
Patient C/O productive cough with yellow sputum for 4 days. Patient C/O abdominal soreness "from coughing" Patient states nasal congestion and having trouble sleeping due to cough. Patient denies fever at home.

## 2023-07-02 NOTE — ED Provider Notes (Signed)
[facility]    CSN: [id number] Arrival date & time: 07/02/23  0935      History   Chief Complaint Chief Complaint  Patient presents with   Cough    HPI Victoria Bush is a 63 y.o. female   presents for evaluation of URI symptoms for 4 days. Patient reports associated symptoms of cough, congestion, decreased appetite. Denies N/V/D, sore throat, fevers, ear pain, body aches, shortness of breath. Patient does not have a hx of asthma. Patient is not an active smoker.   Reports no no sick contacts.  Pt has taken cough medicine OTC for symptoms. Pt has no other concerns at this time.    Cough   Past Medical History:  Diagnosis Date   Anemia    Migraine    no aura; lifetime history   Vaginal dysplasia    high grade   Wears glasses     Patient Active Problem List   Diagnosis Date Noted   Elevated LDL cholesterol level 05/30/2023   Prediabetes 05/30/2023   Hypokalemia 05/30/2023   BMI 38.0-38.9,adult 02/16/2023   Elevated TSH 02/05/2022   Need for shingles vaccine 02/05/2022   Flu vaccine need 02/05/2022   Essential hypertension 11/24/2021   Other fatigue 11/24/2021   Stress due to illness of family member 11/24/2021   VAIN (vaginal intraepithelial neoplasia) 06/12/2021   History of cervical dysplasia 06/12/2021   Vitamin D deficiency 08/05/2015   Hyperlipidemia 08/05/2015   Screening for diabetes mellitus 08/05/2015   Allergic rhinitis 08/05/2015    Past Surgical History:  Procedure Laterality Date   ABDOMINAL HYSTERECTOMY  06/19/2012   Procedure: HYSTERECTOMY ABDOMINAL;  Surgeon: Leslie Andrea, MD;  Location: [facility];  Service: Gynecology;  Laterality: N/A;   breast cystectomy bilateral     yrs ago   CO2 LASER APPLICATION N/A 06/30/2021   Procedure: aborted CO2 laser application;  Surgeon: Carver Fila, MD;  [facility];  Service: Gynecology;  Laterality: N/A;   CO2 LASER APPLICATION N/A 07/28/2021   Procedure: CO2  LASER OF VAGINA;  Surgeon: Carver Fila, MD;  Location: [facility];  Service: Gynecology;  Laterality: N/A;   colonscopy     few yrs ago   MYOMECTOMY ABDOMINAL APPROACH     x2, laparotomy yrs ago   SALPINGOOPHORECTOMY  06/19/2012   Procedure: SALPINGO OOPHORECTOMY;  Surgeon: Roselle Locus II, MD;  Location: [facility];  Service: Gynecology;  Laterality: Bilateral;   VAGINECTOMY, PARTIAL N/A 06/30/2021   Procedure: vaginectomy, partial aborted vaginal biopsy;  Surgeon: Carver Fila, MD;  Location: [facility];  Service: Gynecology;  Laterality: N/A;   WISDOM TOOTH EXTRACTION     yrs ago    OB History     Gravida  0   Para  0   Term  0   Preterm  0   AB  0   Living  0      SAB  0   IAB  0   Ectopic  0   Multiple  0   Live Births  0            Home Medications    Prior to Admission medications   Medication Sig Start Date End Date Taking? Authorizing Provider  promethazine-dextromethorphan (PROMETHAZINE-DM) 6.25-15 MG/5ML syrup Take 5 mLs by mouth 4 (four) times daily as needed for cough. 07/02/23  Yes Radford Pax, NP  atorvastatin (LIPITOR) 20 MG tablet Take 1  tablet (20 mg total) by mouth daily. 06/16/23   Mliss Sax, MD  b complex vitamins tablet Take 1 tablet by mouth daily.    [provider]  BIOTIN FORTE PO Take by mouth daily.    [provider]  cetirizine (ZYRTEC ALLERGY) 10 MG tablet Take 1 tablet (10 mg total) by mouth daily. 10/29/22   Wallis Bamberg, PA-C  cholecalciferol (VITAMIN D) 1000 units tablet Take 1,000 Units by mouth daily.    [provider]  hydrochlorothiazide (HYDRODIURIL) 25 MG tablet Take 1 tablet (25 mg total) by mouth daily. 05/30/23   Mliss Sax, MD  Omega-3 Fatty Acids (FISH OIL) 1200 MG CAPS Take 1,200 mg by mouth daily.    [provider]  potassium chloride (KLOR-CON M) 10 MEQ tablet Take 1 tablet (10 mEq total) by mouth 2 (two)  times daily. 05/30/23   Mliss Sax, MD  vitamin C (ASCORBIC ACID) 500 MG tablet Take 500 mg by mouth daily.    [provider]  vitamin E 1000 UNIT capsule Take 1,000 Units by mouth daily.    [provider]    Family History Family History  Problem Relation Age of Onset   Cancer Mother    Esophageal cancer Mother    Esophageal cancer Father    Mental illness Sister    Diabetes Sister    Colon cancer Neg Hx    Colon polyps Neg Hx    Breast cancer Neg Hx    Ovarian cancer Neg Hx    Endometrial cancer Neg Hx    Pancreatic cancer Neg Hx    Prostate cancer Neg Hx     Social History Social History   Tobacco Use   Smoking status: Never   Smokeless tobacco: Never  Vaping Use   Vaping status: Never Used  Substance Use Topics   Alcohol use: Yes    Alcohol/week: 0.0 standard drinks of alcohol    Comment: rarely   Drug use: No     Allergies   Patient has no known allergies.   Review of Systems Review of Systems  Constitutional:  Positive for appetite change.  HENT:  Positive for congestion.   Respiratory:  Positive for cough.      Physical Exam Triage Vital Signs ED Triage Vitals  Encounter Vitals Group     BP 07/02/23 1037 105/65     Systolic BP Percentile --      Diastolic BP Percentile --      Pulse Rate 07/02/23 1037 90     Resp 07/02/23 1037 18     Temp 07/02/23 1037 99.9 F (37.7 C)     Temp Source 07/02/23 1037 Oral     SpO2 07/02/23 1037 94 %     Weight --      Height --      Head Circumference --      Peak Flow --      Pain Score 07/02/23 1041 9     Pain Loc --      Pain Education --      Exclude from Growth Chart --    No data found.  Updated Vital Signs BP 105/65 (BP Location: Left Arm)   Pulse 90   Temp 99.9 F (37.7 C) (Oral)   Resp 18   SpO2 94%   Visual Acuity Right Eye Distance:   Left Eye Distance:   Bilateral Distance:    Right Eye Near:   Left Eye Near:  Bilateral Near:     Physical  Exam Vitals and nursing note reviewed.  Constitutional:      General: She is not in acute distress.    Appearance: She is well-developed. She is not ill-appearing.  HENT:     Head: Normocephalic and atraumatic.     Right Ear: Tympanic membrane and ear canal normal.     Left Ear: Tympanic membrane and ear canal normal.     Nose: Congestion present.     Mouth/Throat:     Mouth: Mucous membranes are moist.     Pharynx: Oropharynx is clear. Uvula midline. No posterior oropharyngeal erythema.     Tonsils: No tonsillar exudate or tonsillar abscesses.  Eyes:     Conjunctiva/sclera: Conjunctivae normal.     Pupils: Pupils are equal, round, and reactive to light.  Cardiovascular:     Rate and Rhythm: Normal rate and regular rhythm.     Heart sounds: Normal heart sounds.  Pulmonary:     Effort: Pulmonary effort is normal.     Breath sounds: Normal breath sounds. No wheezing or rhonchi.  Musculoskeletal:     Cervical back: Normal range of motion and neck supple.  Lymphadenopathy:     Cervical: No cervical adenopathy.  Skin:    General: Skin is warm and dry.  Neurological:     General: No focal deficit present.     Mental Status: She is alert and oriented to person, place, and time.  Psychiatric:        Mood and Affect: Mood normal.        Behavior: Behavior normal.      UC Treatments / Results  Labs (all labs ordered are listed, but only abnormal results are displayed) Labs Reviewed  POC COVID19/FLU A&B COMBO    EKG   Radiology No results found.  Procedures Procedures (including critical care time)  Medications Ordered in UC Medications - No data to display  Initial Impression / Assessment and Plan / UC Course  I have reviewed the triage vital signs and the nursing notes.  Pertinent labs & imaging results that were available during my care of the patient were reviewed by me and considered in my medical decision making (see chart for details).  Clinical Course as of  07/02/23 1115  Sat Jul 02, 2023  1114 O2 recheck 96% on RA [JM]    Clinical Course User Index [JM] Radford Pax, NP    Reviewed exam and symptoms with patient.  No red flags.  Negative rapid flu and COVID test.  Discussed viral illness and symptomatic treatment.  Will start breath to give as needed for cough, side effect profile reviewed.  Advised PCP follow-up 2 to 3 days for recheck.  ER precautions reviewed. Final Clinical Impressions(s) / UC Diagnoses   Final diagnoses:  Viral illness     Discharge Instructions      You may take Promethazine DM as needed for cough.  Please note this medication can make you drowsy.  Do not drink alcohol or drive on this medication.  Lots of rest and fluids.  Please follow-up with your PCP in 2 to 3 days for recheck.  Please go to the ER for any worsening symptoms.  I hope you feel better soon!     ED Prescriptions     Medication Sig Dispense Auth. Provider   promethazine-dextromethorphan (PROMETHAZINE-DM) 6.25-15 MG/5ML syrup Take 5 mLs by mouth 4 (four) times daily as needed for cough. 118 mL Radford Pax,  NP      PDMP not reviewed this encounter.   Radford Pax, NP 07/02/23 1115

## 2023-07-02 NOTE — Discharge Instructions (Signed)
You may take Promethazine DM as needed for cough.  Please note this medication can make you drowsy.  Do not drink alcohol or drive on this medication.  Lots of rest and fluids.  Please follow-up with your PCP in 2 to 3 days for recheck.  Please go to the ER for any worsening symptoms.  I hope you feel better soon!

## 2023-08-03 ENCOUNTER — Telehealth: Payer: Self-pay | Admitting: Family Medicine

## 2023-08-03 NOTE — Telephone Encounter (Signed)
 Copied from CRM [id number]. Topic: General - Phone/Fax/Address >> Aug 03, 2023 10:06 AM Alcus Dad wrote: Patient/patient representative is calling for clinic's phone, fax, or address information. Thomas @ [employer] needs Dr. To review paperwork faxed and need approval or denial on paperwork sent to Dr. Doreene Burke. Please call [phone] Reference number [id number]   Camelia Phenes 08/03/2023 1:00p Called Administrator on behalf of [employer] Spoke to [place]   Request to switch to potassium chloride K-tab generic between 8 MBQ, , or for lower payment.  She is resending form to accept or decline above changes to [phone].

## 2023-08-04 MED ORDER — POTASSIUM CHLORIDE ER 10 MEQ PO TBCR: 10.0000 meq | EXTENDED_RELEASE_TABLET | Freq: Every day | ORAL | 3 refills | Status: DC

## 2023-09-02 ENCOUNTER — Other Ambulatory Visit: Payer: Self-pay | Admitting: Family Medicine

## 2023-09-02 DIAGNOSIS — I10 Essential (primary) hypertension: Secondary | ICD-10-CM

## 2023-09-07 NOTE — Telephone Encounter (Signed)
 Copied from CRM [id number]. Topic: General - Other >> Sep 07, 2023  9:34 AM [place] A wrote: Reason for CRM: Caller disconnected while on phone with [employer] regarding a possible Prior Authorization-Agent copied number from Caller ID [phone]

## 2023-09-07 NOTE — Telephone Encounter (Signed)
 Copied from CRM [id number]. Topic: General - Other >> Sep 06, 2023  4:17 PM Eunice Blase wrote: Reason for CRM: Received call from [employer] per [facility] ph: [phone] fax: [phone] Atorvastatin 20 mg tablet change to Lovastatin or Simvastatin.

## 2023-11-28 ENCOUNTER — Ambulatory Visit (INDEPENDENT_AMBULATORY_CARE_PROVIDER_SITE_OTHER): Admitting: Family Medicine

## 2023-11-28 ENCOUNTER — Encounter: Payer: Self-pay | Admitting: Family Medicine

## 2023-11-28 VITALS — BP 105/65 | HR 86 | Temp 97.8°F | Ht 59.0 in | Wt 153.4 lb

## 2023-11-28 DIAGNOSIS — E7800 Pure hypercholesterolemia, unspecified: Secondary | ICD-10-CM | POA: Diagnosis not present

## 2023-11-28 DIAGNOSIS — R7303 Prediabetes: Secondary | ICD-10-CM

## 2023-11-28 DIAGNOSIS — I10 Essential (primary) hypertension: Secondary | ICD-10-CM

## 2023-11-28 DIAGNOSIS — F4321 Adjustment disorder with depressed mood: Secondary | ICD-10-CM

## 2023-11-28 DIAGNOSIS — Z0000 Encounter for general adult medical examination without abnormal findings: Secondary | ICD-10-CM

## 2023-11-28 NOTE — Progress Notes (Signed)
 Established Patient Office Visit   Subjective:  Patient ID: Victoria Bush, female    DOB: 10/18/1960  Age: 63 y.o. MRN: [id number]  Chief Complaint  Patient presents with   Annual Exam    Cpe.water  ,fasting    HPI Encounter Diagnoses  Name Primary?   Healthcare maintenance Yes   Prediabetes    Elevated LDL cholesterol level    Primary hypertension    Grieving    For a physical today.  Of course she continues to grieve after losing her husband to esophageal cancer this past March.  Her sister lives with her and has been supportive.  She does have friends.  She believes she has found a new church.  She has been able to lose weight and her blood pressure has come down.  It has been running in the low 100s over 60s.  Continues with atorvastatin  for elevated cholesterol.   Review of Systems  Constitutional: Negative.   HENT: Negative.    Eyes:  Negative for blurred vision, discharge and redness.  Respiratory: Negative.    Cardiovascular: Negative.   Gastrointestinal:  Negative for abdominal pain.  Genitourinary: Negative.   Musculoskeletal: Negative.  Negative for myalgias.  Skin:  Negative for rash.  Neurological:  Negative for tingling, loss of consciousness and weakness.  Endo/Heme/Allergies:  Negative for polydipsia.  Psychiatric/Behavioral:  Positive for depression. The patient is nervous/anxious and has insomnia.       11/28/2023    2:47 PM 02/05/2022    8:02 AM 11/24/2021    9:23 AM  Depression screen PHQ 2/9  Decreased Interest 2 0 0  Down, Depressed, Hopeless 1 0 0  PHQ - 2 Score 3 0 0  Altered sleeping 3  0  Tired, decreased energy 0  0  Change in appetite 1  0  Feeling bad or failure about yourself  0  0  Trouble concentrating 0  0  Moving slowly or fidgety/restless 0  0  Suicidal thoughts 0  0  PHQ-9 Score 7  0  Difficult doing work/chores Somewhat difficult  Not difficult at all      Current Outpatient Medications:    atorvastatin  (LIPITOR) 20 MG  tablet, Take 1 tablet (20 mg total) by mouth daily., Disp: 90 tablet, Rfl: 3   b complex vitamins tablet, Take 1 tablet by mouth daily., Disp: , Rfl:    BIOTIN FORTE PO, Take by mouth daily., Disp: , Rfl:    cetirizine  (ZYRTEC  ALLERGY) 10 MG tablet, Take 1 tablet (10 mg total) by mouth daily., Disp: 30 tablet, Rfl: 0   cholecalciferol (VITAMIN D ) 1000 units tablet, Take 1,000 Units by mouth daily., Disp: , Rfl:    Omega-3 Fatty Acids (FISH OIL) 1200 MG CAPS, Take 1,200 mg by mouth daily., Disp: , Rfl:    vitamin C (ASCORBIC ACID) 500 MG tablet, Take 500 mg by mouth daily., Disp: , Rfl:    vitamin E 1000 UNIT capsule, Take 1,000 Units by mouth daily., Disp: , Rfl:    Objective:     BP 105/65 (Cuff Size: Normal)   Pulse 86   Temp 97.8 F (36.6 C) (Temporal)   Ht 4' 11 (1.499 m)   Wt 153 lb 6.4 oz (69.6 kg)   SpO2 96%   BMI 30.98 kg/m  BP Readings from Last 3 Encounters:  11/28/23 105/65  07/02/23 105/65  06/16/23 118/84   Wt Readings from Last 3 Encounters:  11/28/23 153 lb 6.4 oz (69.6 kg)  06/16/23  161 lb 9.6 oz (73.3 kg)  05/30/23 160 lb 12.8 oz (72.9 kg)      Physical Exam Constitutional:      General: She is not in acute distress.    Appearance: Normal appearance. She is not ill-appearing, toxic-appearing or diaphoretic.  HENT:     Head: Normocephalic and atraumatic.     Right Ear: Tympanic membrane, ear canal and external ear normal.     Left Ear: Tympanic membrane, ear canal and external ear normal.     Mouth/Throat:     Mouth: Mucous membranes are moist.     Pharynx: Oropharynx is clear. No oropharyngeal exudate or posterior oropharyngeal erythema.   Eyes:     General: No scleral icterus.       Right eye: No discharge.        Left eye: No discharge.     Extraocular Movements: Extraocular movements intact.     Conjunctiva/sclera: Conjunctivae normal.     Pupils: Pupils are equal, round, and reactive to light.    Cardiovascular:     Rate and Rhythm: Normal  rate and regular rhythm.  Pulmonary:     Effort: Pulmonary effort is normal. No respiratory distress.     Breath sounds: Normal breath sounds. No wheezing or rales.  Abdominal:     General: Bowel sounds are normal.     Tenderness: There is no right CVA tenderness or left CVA tenderness.   Musculoskeletal:     Cervical back: No rigidity or tenderness.   Skin:    General: Skin is warm and dry.   Neurological:     Mental Status: She is alert and oriented to person, place, and time.   Psychiatric:        Mood and Affect: Mood normal.        Behavior: Behavior normal.      No results found for any visits on 11/28/23.    The 10-year ASCVD risk score (Arnett DK, et al., 2019) is: 4.3%    Assessment & Plan:   Healthcare maintenance  Prediabetes -     Lipid panel -     Hemoglobin A1c  Elevated LDL cholesterol level -     Comprehensive metabolic panel with GFR  Primary hypertension -     CBC with Differential/Platelet -     Comprehensive metabolic panel with GFR -     Urinalysis, Routine w reflex microscopic  Grieving    Return in about 3 months (around 02/28/2024), or Stop HCTZ.  Please check blood pressures periodically..  Information was given on health maintenance and disease mention.  Continue exercising and spending time with friends and family.  Advised that time takes time.  Victoria Sim Lent, MD

## 2023-11-29 ENCOUNTER — Ambulatory Visit: Payer: Self-pay | Admitting: Family Medicine

## 2023-11-29 LAB — COMPREHENSIVE METABOLIC PANEL WITH GFR
ALT: 14 U/L (ref 0–35)
AST: 19 U/L (ref 0–37)
Albumin: 4.3 g/dL (ref 3.5–5.2)
Alkaline Phosphatase: 84 U/L (ref 39–117)
BUN: 8 mg/dL (ref 6–23)
CO2: 31 meq/L (ref 19–32)
Calcium: 9.8 mg/dL (ref 8.4–10.5)
Chloride: 101 meq/L (ref 96–112)
Creatinine, Ser: 0.88 mg/dL (ref 0.40–1.20)
GFR: 70.97 mL/min (ref >60.00)
Glucose, Bld: 87 mg/dL (ref 70–99)
Potassium: 3.6 meq/L (ref 3.5–5.1)
Sodium: 138 meq/L (ref 135–145)
Total Bilirubin: 0.5 mg/dL (ref 0.2–1.2)
Total Protein: 7.6 g/dL (ref 6.0–8.3)

## 2023-11-29 LAB — URINALYSIS, ROUTINE W REFLEX MICROSCOPIC
Bilirubin Urine: NEGATIVE
Hgb urine dipstick: NEGATIVE
Ketones, ur: NEGATIVE
Leukocytes,Ua: NEGATIVE
Nitrite: NEGATIVE
RBC / HPF: NONE SEEN (ref 0–?)
Specific Gravity, Urine: 1.01 (ref 1.000–1.030)
Total Protein, Urine: NEGATIVE
Urine Glucose: NEGATIVE
Urobilinogen, UA: 0.2 (ref 0.0–1.0)
pH: 6.5 (ref 5.0–8.0)

## 2023-11-29 LAB — CBC WITH DIFFERENTIAL/PLATELET
Basophils Absolute: 0.1 10*3/uL (ref 0.0–0.1)
Basophils Relative: 1.6 % (ref 0.0–3.0)
Eosinophils Absolute: 0.2 10*3/uL (ref 0.0–0.7)
Eosinophils Relative: 2.6 % (ref 0.0–5.0)
HCT: 39.6 % (ref 36.0–46.0)
Hemoglobin: 13 g/dL (ref 12.0–15.0)
Lymphocytes Relative: 51.4 % — ABNORMAL HIGH (ref 12.0–46.0)
Lymphs Abs: 3.6 10*3/uL (ref 0.7–4.0)
MCHC: 32.7 g/dL (ref 30.0–36.0)
MCV: 81.2 fl (ref 78.0–100.0)
Monocytes Absolute: 0.6 10*3/uL (ref 0.1–1.0)
Monocytes Relative: 8.7 % (ref 3.0–12.0)
Neutro Abs: 2.5 10*3/uL (ref 1.4–7.7)
Neutrophils Relative %: 35.7 % — ABNORMAL LOW (ref 43.0–77.0)
Platelets: 344 10*3/uL (ref 150.0–400.0)
RBC: 4.88 Mil/uL (ref 3.87–5.11)
RDW: 13.3 % (ref 11.5–15.5)
WBC: 7 10*3/uL (ref 4.0–10.5)

## 2023-11-29 LAB — LIPID PANEL
Cholesterol: 189 mg/dL (ref 0–200)
HDL: 57 mg/dL (ref >39.00)
LDL Cholesterol: 114 mg/dL — ABNORMAL HIGH (ref 0–99)
NonHDL: 132.03
Total CHOL/HDL Ratio: 3
Triglycerides: 91 mg/dL (ref 0.0–149.0)
VLDL: 18.2 mg/dL (ref 0.0–40.0)

## 2023-11-29 LAB — HEMOGLOBIN A1C: Hgb A1c MFr Bld: 6.3 % (ref 4.6–6.5)

## 2024-02-28 ENCOUNTER — Ambulatory Visit: Admitting: Family Medicine

## 2024-03-24 LAB — AMB RESULTS CONSOLE CBG: Glucose: 111

## 2024-03-24 NOTE — Progress Notes (Signed)
 Pt screened for SDOH needs, HTN, Blood sugar. BP taken times 2. Educated on risk of uncontrolled HTN. Healthy lifestyle and eating habits. Follow up with PCP

## 2024-04-05 ENCOUNTER — Ambulatory Visit: Admitting: Family Medicine

## 2024-04-05 ENCOUNTER — Encounter: Payer: Self-pay | Admitting: Family Medicine

## 2024-04-05 VITALS — BP 132/78 | HR 61 | Temp 97.2°F | Ht 59.0 in | Wt 154.2 lb

## 2024-04-05 DIAGNOSIS — Z23 Encounter for immunization: Secondary | ICD-10-CM | POA: Diagnosis not present

## 2024-04-05 DIAGNOSIS — R7303 Prediabetes: Secondary | ICD-10-CM | POA: Diagnosis not present

## 2024-04-05 DIAGNOSIS — E559 Vitamin D deficiency, unspecified: Secondary | ICD-10-CM

## 2024-04-05 DIAGNOSIS — F4321 Adjustment disorder with depressed mood: Secondary | ICD-10-CM | POA: Diagnosis not present

## 2024-04-05 DIAGNOSIS — E7800 Pure hypercholesterolemia, unspecified: Secondary | ICD-10-CM | POA: Diagnosis not present

## 2024-04-05 DIAGNOSIS — R030 Elevated blood-pressure reading, without diagnosis of hypertension: Secondary | ICD-10-CM

## 2024-04-05 DIAGNOSIS — R7989 Other specified abnormal findings of blood chemistry: Secondary | ICD-10-CM

## 2024-04-05 MED ORDER — ATORVASTATIN CALCIUM 20 MG PO TABS: 20.0000 mg | ORAL_TABLET | Freq: Every day | ORAL | 3 refills | Status: AC

## 2024-04-05 NOTE — Progress Notes (Signed)
 Established Patient Office Visit   Subjective:  Patient ID: Victoria Bush, female    DOB: 1961/05/24  Age: 63 y.o. MRN: [id number]  Chief Complaint  Patient presents with   Medical Management of Chronic Issues    3 month follow up. Pt is not fasting. Pt complains of a elevated blood pressure reading at a health fair recently.     HPI Encounter Diagnoses  Name Primary?   Elevated BP reading w/ no diagnosis of HTN Yes   Elevated LDL cholesterol level    Elevated TSH    Grieving    Prediabetes    Immunization due    Vitamin D  deficiency    For follow-up of above.  Was at a health fair a few weeks ago and blood pressure was elevated.  She has been exercising by walking and been able to lose a few pounds.  Of course she continues to grieve after Jerome's death this past September 04, 2023.    Review of Systems  Constitutional: Negative.   HENT: Negative.    Eyes:  Negative for blurred vision, discharge and redness.  Respiratory: Negative.    Cardiovascular: Negative.   Gastrointestinal:  Negative for abdominal pain.  Genitourinary: Negative.   Musculoskeletal: Negative.  Negative for myalgias.  Skin:  Negative for rash.  Neurological:  Negative for tingling, loss of consciousness and weakness.  Endo/Heme/Allergies:  Negative for polydipsia.     Current Outpatient Medications:    b complex vitamins tablet, Take 1 tablet by mouth daily., Disp: , Rfl:    BIOTIN FORTE PO, Take by mouth daily., Disp: , Rfl:    cetirizine  (ZYRTEC  ALLERGY) 10 MG tablet, Take 1 tablet (10 mg total) by mouth daily., Disp: 30 tablet, Rfl: 0   cholecalciferol (VITAMIN D ) 1000 units tablet, Take 1,000 Units by mouth daily., Disp: , Rfl:    Omega-3 Fatty Acids (FISH OIL) 1200 MG CAPS, Take 1,200 mg by mouth daily., Disp: , Rfl:    vitamin C (ASCORBIC ACID) 500 MG tablet, Take 500 mg by mouth daily., Disp: , Rfl:    vitamin E 1000 UNIT capsule, Take 1,000 Units by mouth daily., Disp: , Rfl:    atorvastatin   (LIPITOR) 20 MG tablet, Take 1 tablet (20 mg total) by mouth daily., Disp: 90 tablet, Rfl: 3   Objective:     BP 132/78 (BP Location: Right Arm, Patient Position: Sitting, Cuff Size: Normal)   Pulse 61   Temp (!) 97.2 F (36.2 C) (Temporal)   Ht 4' 11 (1.499 m)   Wt 154 lb 3.2 oz (69.9 kg)   SpO2 98%   BMI 31.14 kg/m  BP Readings from Last 3 Encounters:  04/05/24 132/78  03/24/24 (!) 148/93  11/28/23 105/65   Wt Readings from Last 3 Encounters:  04/05/24 154 lb 3.2 oz (69.9 kg)  11/28/23 153 lb 6.4 oz (69.6 kg)  06/16/23 161 lb 9.6 oz (73.3 kg)      Physical Exam Constitutional:      General: She is not in acute distress.    Appearance: Normal appearance. She is not ill-appearing, toxic-appearing or diaphoretic.  HENT:     Head: Normocephalic and atraumatic.     Right Ear: External ear normal.     Left Ear: External ear normal.  Eyes:     General: No scleral icterus.       Right eye: No discharge.        Left eye: No discharge.     Extraocular Movements: Extraocular  movements intact.     Conjunctiva/sclera: Conjunctivae normal.  Pulmonary:     Effort: Pulmonary effort is normal. No respiratory distress.  Skin:    General: Skin is warm and dry.  Neurological:     Mental Status: She is alert and oriented to person, place, and time.  Psychiatric:        Mood and Affect: Mood normal.        Behavior: Behavior normal.      No results found for any visits on 04/05/24.    The 10-year ASCVD risk score (Arnett DK, et al., 2019) is: 5.7%    Assessment & Plan:   Elevated BP reading w/ no diagnosis of HTN -     Basic metabolic panel with GFR -     CBC  Elevated LDL cholesterol level -     LDL cholesterol, direct -     Atorvastatin  Calcium ; Take 1 tablet (20 mg total) by mouth daily.  Dispense: 90 tablet; Refill: 3  Elevated TSH -     TSH  Grieving  Prediabetes -     Basic metabolic panel with GFR -     Hemoglobin A1c  Immunization due -     Flu  vaccine trivalent PF, 6mos and older(Flulaval,Afluria,Fluarix,Fluzone) -     Pneumococcal conjugate vaccine 20-valent  Vitamin D  deficiency -     VITAMIN D  25 Hydroxy (Vit-D Deficiency, Fractures)    Return in about 3 months (around 07/06/2024), or Please check and record blood pressures.  Information given on preventing hypertension and helping obtaining a blood pressure.  Adjustments made pending results of today's lab work.  Advise follow-up with GYN with history of vaginal lesion.   Elsie Sim Lent, MD

## 2024-04-06 ENCOUNTER — Ambulatory Visit: Payer: Self-pay | Admitting: Family Medicine

## 2024-04-06 LAB — BASIC METABOLIC PANEL WITH GFR
BUN: 9 mg/dL (ref 6–23)
CO2: 27 meq/L (ref 19–32)
Calcium: 9.6 mg/dL (ref 8.4–10.5)
Chloride: 104 meq/L (ref 96–112)
Creatinine, Ser: 0.85 mg/dL (ref 0.40–1.20)
GFR: 73.81 mL/min (ref >60.00)
Glucose, Bld: 125 mg/dL — ABNORMAL HIGH (ref 70–99)
Potassium: 3.6 meq/L (ref 3.5–5.1)
Sodium: 139 meq/L (ref 135–145)

## 2024-04-06 LAB — LDL CHOLESTEROL, DIRECT: Direct LDL: 100 mg/dL

## 2024-04-06 LAB — CBC
HCT: 40.4 % (ref 36.0–46.0)
Hemoglobin: 13.3 g/dL (ref 12.0–15.0)
MCHC: 32.9 g/dL (ref 30.0–36.0)
MCV: 80.2 fl (ref 78.0–100.0)
Platelets: 301 K/uL (ref 150.0–400.0)
RBC: 5.04 Mil/uL (ref 3.87–5.11)
RDW: 13 % (ref 11.5–15.5)
WBC: 6.4 K/uL (ref 4.0–10.5)

## 2024-04-06 LAB — HEMOGLOBIN A1C: Hgb A1c MFr Bld: 6.3 % (ref 4.6–6.5)

## 2024-04-06 LAB — TSH: TSH: 3.26 u[IU]/mL (ref 0.35–5.50)

## 2024-04-06 LAB — VITAMIN D 25 HYDROXY (VIT D DEFICIENCY, FRACTURES): VITD: 30.51 ng/mL (ref 30.00–100.00)

## 2024-05-02 LAB — HM MAMMOGRAPHY

## 2024-05-02 NOTE — Progress Notes (Signed)
 The patient attended a screening event on 03/24/2024 where her BP screening results was 148/93 and non-fasting blood glucose 111. At the event the patient noted she has [employer] and does not smoke. Patient did not have any SDOH insecurities. Pt listed pcp as Elsie Sim Lent, MD. At the screening event pt was educated on the risk of uncontrolled HTN, healthy lifestyle and eating habits by clinician. At the screening event pt was advised to follow-up with a pcp.   Per chart review pt has a pcp and the last office visit was 04/05/2024 for 3 month follow up for medical management of chronic issues/pt complains of a elevated blood pressure reading at the health fair recently. The pt BP was 132/78 on 04/05/2024. According to chart pt is instructed to check and record blood pressure at home and information given on preventing hypertension, helping to obtain blood pressure and pt was advised to follow up with OBGYN by the pcp on 04/05/2024. Chart review also indicates a future appt with pcp on 07/06/2024.  No additional [employer] team support indicated at this time.

## 2024-05-07 ENCOUNTER — Encounter: Payer: Self-pay | Admitting: Family Medicine

## 2024-07-06 ENCOUNTER — Ambulatory Visit: Admitting: Family Medicine
